# Patient Record
Sex: Male | Born: 1955
Health system: Southern US, Community
[De-identification: ages and names within clinical notes are randomized; demographics above are authoritative.]

## PROBLEM LIST (undated history)

## (undated) DIAGNOSIS — R002 Palpitations: Secondary | ICD-10-CM

## (undated) DIAGNOSIS — I10 Essential (primary) hypertension: Secondary | ICD-10-CM

## (undated) DIAGNOSIS — E785 Hyperlipidemia, unspecified: Secondary | ICD-10-CM

## (undated) DIAGNOSIS — I471 Supraventricular tachycardia, unspecified: Secondary | ICD-10-CM

## (undated) DIAGNOSIS — E876 Hypokalemia: Secondary | ICD-10-CM

## (undated) DIAGNOSIS — I499 Cardiac arrhythmia, unspecified: Secondary | ICD-10-CM

## (undated) HISTORY — DX: Hypokalemia: E87.6

## (undated) HISTORY — DX: Essential (primary) hypertension: I10

## (undated) HISTORY — DX: Supraventricular tachycardia: I47.1

## (undated) HISTORY — PX: TONSILLECTOMY: SUR1361

## (undated) HISTORY — DX: Palpitations: R00.2

## (undated) HISTORY — DX: Hyperlipidemia, unspecified: E78.5

## (undated) HISTORY — DX: Supraventricular tachycardia, unspecified: I47.10

---

## 2006-08-18 ENCOUNTER — Emergency Department: Payer: Self-pay | Admitting: Emergency Medicine

## 2010-12-26 ENCOUNTER — Emergency Department: Payer: Self-pay | Admitting: Emergency Medicine

## 2013-02-28 ENCOUNTER — Ambulatory Visit: Payer: Self-pay | Admitting: General Practice

## 2013-04-29 LAB — CBC
HGB: 16.6 g/dL (ref 13.0–18.0)
MCV: 88 fL (ref 80–100)
RBC: 5.5 10*6/uL (ref 4.40–5.90)
RDW: 13.2 % (ref 11.5–14.5)
WBC: 7.6 10*3/uL (ref 3.8–10.6)

## 2013-04-29 LAB — BASIC METABOLIC PANEL
Anion Gap: 6 — ABNORMAL LOW (ref 7–16)
Chloride: 99 mmol/L (ref 98–107)
Co2: 30 mmol/L (ref 21–32)
Creatinine: 1.31 mg/dL — ABNORMAL HIGH (ref 0.60–1.30)
EGFR (Non-African Amer.): 60 — ABNORMAL LOW
Glucose: 144 mg/dL — ABNORMAL HIGH (ref 65–99)
Osmolality: 275 (ref 275–301)
Potassium: 3.2 mmol/L — ABNORMAL LOW (ref 3.5–5.1)

## 2013-04-29 LAB — CK TOTAL AND CKMB (NOT AT ARMC): CK-MB: 4.9 ng/mL — ABNORMAL HIGH (ref 0.5–3.6)

## 2013-04-30 ENCOUNTER — Observation Stay: Payer: Self-pay | Admitting: Internal Medicine

## 2013-04-30 DIAGNOSIS — I471 Supraventricular tachycardia: Secondary | ICD-10-CM

## 2013-04-30 DIAGNOSIS — I10 Essential (primary) hypertension: Secondary | ICD-10-CM

## 2013-04-30 DIAGNOSIS — R0602 Shortness of breath: Secondary | ICD-10-CM

## 2013-04-30 DIAGNOSIS — R7989 Other specified abnormal findings of blood chemistry: Secondary | ICD-10-CM

## 2013-04-30 LAB — CK TOTAL AND CKMB (NOT AT ARMC): CK-MB: 4.3 ng/mL — ABNORMAL HIGH (ref 0.5–3.6)

## 2013-05-12 ENCOUNTER — Telehealth: Payer: Self-pay

## 2013-05-12 NOTE — Telephone Encounter (Signed)
Patient contacted regarding discharge from [facility] on 04/30/13.  Patient understands to follow up with provider Dr. Mariah Milling on 05/18/13 at 10:30 at [facility]. Patient understands discharge instructions? yes Patient understands medications and regiment? yes Patient understands to bring all medications to this visit? yes

## 2013-05-17 ENCOUNTER — Encounter: Payer: Self-pay | Admitting: Cardiovascular Disease

## 2013-05-17 ENCOUNTER — Ambulatory Visit (INDEPENDENT_AMBULATORY_CARE_PROVIDER_SITE_OTHER): Payer: BC Managed Care – PPO | Admitting: Cardiovascular Disease

## 2013-05-17 ENCOUNTER — Telehealth: Payer: Self-pay

## 2013-05-17 VITALS — BP 140/100 | HR 84 | Ht 70.0 in | Wt 228.2 lb

## 2013-05-17 DIAGNOSIS — E785 Hyperlipidemia, unspecified: Secondary | ICD-10-CM | POA: Insufficient documentation

## 2013-05-17 DIAGNOSIS — I498 Other specified cardiac arrhythmias: Secondary | ICD-10-CM

## 2013-05-17 DIAGNOSIS — R002 Palpitations: Secondary | ICD-10-CM

## 2013-05-17 DIAGNOSIS — I471 Supraventricular tachycardia: Secondary | ICD-10-CM | POA: Insufficient documentation

## 2013-05-17 DIAGNOSIS — I10 Essential (primary) hypertension: Secondary | ICD-10-CM

## 2013-05-17 MED ORDER — ATORVASTATIN CALCIUM 10 MG PO TABS: 10.0000 mg | ORAL_TABLET | Freq: Every day | ORAL | Status: DC

## 2013-05-17 MED ORDER — DILTIAZEM HCL ER COATED BEADS 240 MG PO CP24: 240.0000 mg | ORAL_CAPSULE | Freq: Every day | ORAL | Status: DC

## 2013-05-17 NOTE — Assessment & Plan Note (Signed)
Cardizem increased to 240 mg daily for better blood pressure control. We've asked him to monitor his blood pressure at home

## 2013-05-17 NOTE — Assessment & Plan Note (Signed)
He does report having very elevated cholesterol in the past. He reports 230 and 260 as his numbers. We will start him on Lipitor 10 mg daily

## 2013-05-17 NOTE — Assessment & Plan Note (Signed)
No further episodes since his discharge. We will increase the diltiazem to 240 mg daily for rate control and better blood pressure control

## 2013-05-17 NOTE — Patient Instructions (Addendum)
You are doing well. Please increase the diltiazem to 240 mg daily  Please start generic zyrtec (cetirazine) one a day for allergy Please start lipitor one a day for cholesterol  Please call us if you have new issues that need to be addressed before your next appt.  Your physician wants you to follow-up in: 6 months.  You will receive a reminder letter in the mail two months in advance. If you don't receive a letter, please call our office to schedule the follow-up appointment.

## 2013-05-17 NOTE — Progress Notes (Signed)
Patient ID: Anthony [name], male    DOB: 1956-02-07, 57 y.o.   MRN: [id number]  HPI Comments: Anthony [name] is a 57 year old with history of hypertension with recent admission to the hospital on 04/30/2013 for narrow complex tachycardia concerning for SVT, hypokalemia, severe hypertension, who presents after his hospital admission for routine followup.  He was found to have heart rate of 160, systolic pressure 220. Troponin climbed to 0.3 felt secondary to rate-related event. His HCTZ was held, potassium was repleted. This was initially 3.2 with mildly elevated creatinine 1.31. He was started on diltiazem 120 mg daily. It was felt that his SVT was secondary to his low potassium.  In followup today, he feels well. Blood pressure has been running slightly high. He denies any chest pain or shortness of breath CT scan in the hospital showed no PE, otherwise normal CT of the chest  EKG shows normal sinus rhythm with rate 84 beats per minute, no significant ST or T wave changes   Outpatient Encounter Prescriptions as of 05/17/2013  Medication Sig Dispense Refill  . aspirin 81 MG tablet Take 81 mg by mouth daily.      . fluticasone (FLONASE) 50 MCG/ACT nasal spray Place 2 sprays into the nose daily.      . [DISCONTINUED] diltiazem (CARDIZEM) 120 MG tablet Take 120 mg by mouth daily.      Marland Kitchen atorvastatin (LIPITOR) 10 MG tablet Take 1 tablet (10 mg total) by mouth daily.  30 tablet  6  . diltiazem (CARDIZEM CD) 240 MG 24 hr capsule Take 1 capsule (240 mg total) by mouth daily.  90 capsule  3   No facility-administered encounter medications on file as of 05/17/2013.     Review of Systems  Constitutional: Negative.   HENT: Negative.   Eyes: Negative.   Respiratory: Negative.   Cardiovascular: Negative.   Gastrointestinal: Negative.   Endocrine: Negative.   Musculoskeletal: Negative.   Skin: Negative.   Allergic/Immunologic: Negative.   Neurological: Negative.   Hematological: Negative.    Psychiatric/Behavioral: Negative.   All other systems reviewed and are negative.    BP 140/100  Pulse 84  Ht 5\' 10"  (1.778 m)  Wt 228 lb 4 oz (103.534 kg)  BMI 32.75 kg/m2  Physical Exam  Nursing note and vitals reviewed. Constitutional: He is oriented to person, place, and time. He appears well-developed and well-nourished.  HENT:  Head: Normocephalic.  Nose: Nose normal.  Mouth/Throat: Oropharynx is clear and moist.  Eyes: Conjunctivae are normal. Pupils are equal, round, and reactive to light.  Neck: Normal range of motion. Neck supple. No JVD present.  Cardiovascular: Normal rate, regular rhythm, S1 normal, S2 normal, normal heart sounds and intact distal pulses.  Exam reveals no gallop and no friction rub.   No murmur heard. Pulmonary/Chest: Effort normal and breath sounds normal. No respiratory distress. He has no wheezes. He has no rales. He exhibits no tenderness.  Abdominal: Soft. Bowel sounds are normal. He exhibits no distension. There is no tenderness.  Musculoskeletal: Normal range of motion. He exhibits no edema and no tenderness.  Lymphadenopathy:    He has no cervical adenopathy.  Neurological: He is alert and oriented to person, place, and time. Coordination normal.  Skin: Skin is warm and dry. No rash noted. No erythema.  Psychiatric: He has a normal mood and affect. His behavior is normal. Judgment and thought content normal.      Assessment and Plan

## 2013-05-17 NOTE — Telephone Encounter (Signed)
Spoke w/ pt.  He was inquiring if Dr. Mariah Milling had any openings today.  Pt is sched for TCM appt tomorrow at 10:00. Pt reports his BP last night was 170/112.  He was prescribed diltiazem 120mg  in hospital 04/30/13. Denies chest pain, tightness, or SOB. We have no openings today, but will call pt if there is a cancellation. Pt to call back this afternoon with today's BP reading.

## 2013-05-18 ENCOUNTER — Ambulatory Visit: Payer: BC Managed Care – PPO | Admitting: Cardiovascular Disease

## 2013-05-27 ENCOUNTER — Encounter: Payer: Self-pay | Admitting: *Deleted

## 2013-06-06 ENCOUNTER — Encounter: Payer: Self-pay | Admitting: Cardiovascular Disease

## 2014-02-01 ENCOUNTER — Encounter: Payer: Self-pay | Admitting: Cardiovascular Disease

## 2014-02-01 ENCOUNTER — Ambulatory Visit (INDEPENDENT_AMBULATORY_CARE_PROVIDER_SITE_OTHER): Payer: BC Managed Care – PPO | Admitting: Cardiovascular Disease

## 2014-02-01 VITALS — BP 142/92 | HR 85 | Ht 70.0 in | Wt 234.5 lb

## 2014-02-01 DIAGNOSIS — I498 Other specified cardiac arrhythmias: Secondary | ICD-10-CM

## 2014-02-01 DIAGNOSIS — I471 Supraventricular tachycardia: Secondary | ICD-10-CM

## 2014-02-01 DIAGNOSIS — I158 Other secondary hypertension: Secondary | ICD-10-CM

## 2014-02-01 DIAGNOSIS — R0602 Shortness of breath: Secondary | ICD-10-CM

## 2014-02-01 DIAGNOSIS — E785 Hyperlipidemia, unspecified: Secondary | ICD-10-CM

## 2014-02-01 NOTE — Patient Instructions (Addendum)
You are doing well. No medication changes were made.  Continue to work on the weight  Please call us if you have new issues that need to be addressed before your next appt.  Your physician wants you to follow-up in: 12 months.  You will receive a reminder letter in the mail two months in advance. If you don't receive a letter, please call our office to schedule the follow-up appointment.

## 2014-02-01 NOTE — Assessment & Plan Note (Signed)
Blood pressure is well controlled on today's visit. No changes made to the medications. 

## 2014-02-01 NOTE — Assessment & Plan Note (Signed)
No further arrhythmia. We'll continue calcium channel blocker

## 2014-02-01 NOTE — Progress Notes (Signed)
   Patient ID: Anthony [name], male    DOB: 04/14/1956, 58 y.o.   MRN: [id number]  HPI Comments: [name] is a 58 year old with history of hypertension with admission to the hospital on 04/30/2013 for narrow complex tachycardia concerning for SVT, hypokalemia, severe hypertension, who presents for routine followup.  On his prior clinic visit, HCTZ was held, he was started on diltiazem. In followup today, he reports that his blood pressure is well controlled at home and denies having any further arrhythmia. Blood pressure typically runs 130s over 80  Recent lab work showing total cholesterol 210  On his prior admission to the hospital, He was found to have heart rate of 160, systolic pressure 220. Troponin climbed to 0.3 felt secondary to rate-related event. His HCTZ was held, potassium was repleted. This was initially 3.2 with mildly elevated creatinine 1.31. He was started on diltiazem 120 mg daily. It was felt that his SVT was secondary to his low potassium.  Prior CT scan in the hospital showed no PE, otherwise normal CT of the chest  EKG shows normal sinus rhythm with rate 85 beats per minute, no significant ST or T wave changes   Outpatient Encounter Prescriptions as of 02/01/2014  Medication Sig  . atorvastatin (LIPITOR) 10 MG tablet Take 1 tablet (10 mg total) by mouth daily.  Marland Kitchen. diltiazem (CARDIZEM CD) 240 MG 24 hr capsule Take 1 capsule (240 mg total) by mouth daily.    Review of Systems  Constitutional: Negative.   HENT: Negative.   Eyes: Negative.   Respiratory: Negative.   Cardiovascular: Negative.   Gastrointestinal: Negative.   Endocrine: Negative.   Musculoskeletal: Negative.   Skin: Negative.   Allergic/Immunologic: Negative.   Neurological: Negative.   Hematological: Negative.   Psychiatric/Behavioral: Negative.   All other systems reviewed and are negative.   BP 142/92  Pulse 85  Ht 5\' 10"  (1.778 m)  Wt 234 lb 8 oz (106.369 kg)  BMI 33.65 kg/m2  Physical  Exam  Nursing note and vitals reviewed. Constitutional: He is oriented to person, place, and time. He appears well-developed and well-nourished.  HENT:  Head: Normocephalic.  Nose: Nose normal.  Mouth/Throat: Oropharynx is clear and moist.  Eyes: Conjunctivae are normal. Pupils are equal, round, and reactive to light.  Neck: Normal range of motion. Neck supple. No JVD present.  Cardiovascular: Normal rate, regular rhythm, S1 normal, S2 normal, normal heart sounds and intact distal pulses.  Exam reveals no gallop and no friction rub.   No murmur heard. Pulmonary/Chest: Effort normal and breath sounds normal. No respiratory distress. He has no wheezes. He has no rales. He exhibits no tenderness.  Abdominal: Soft. Bowel sounds are normal. He exhibits no distension. There is no tenderness.  Musculoskeletal: Normal range of motion. He exhibits no edema and no tenderness.  Lymphadenopathy:    He has no cervical adenopathy.  Neurological: He is alert and oriented to person, place, and time. Coordination normal.  Skin: Skin is warm and dry. No rash noted. No erythema.  Psychiatric: He has a normal mood and affect. His behavior is normal. Judgment and thought content normal.      Assessment and Plan

## 2014-02-01 NOTE — Assessment & Plan Note (Addendum)
We did discuss increasing the dose of his Lipitor up to 20 mg daily. He prefers diet and exercise to start.

## 2014-06-11 ENCOUNTER — Emergency Department: Payer: Self-pay | Admitting: Emergency Medicine

## 2014-11-24 NOTE — Consult Note (Signed)
General Aspect Anthony [name] is a 59 year old African American gentleman with past medical history of hypertension presenting with chest discomfort and palpitations. Cardiology was consulted for narrow complex tachycardia/arrhythmia with severe HTN.   He described having an episode of chest discomfort with associated palpitations.  He describes his chest discomfort as 2 to 3 out of 10 in intensity, nonradiating, located in the left chest.  No worsening or relieving factors.  He noticed associated fluttering sensation.  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  He then confirmed this by using automated blood pressure cuff and found to be hypertensive around 220/120 with heart rate in the 160s at that time, so he decided to present to the Emergency Department.  He was found to be tachycardic with a heart rate of 150 and then this spontaneously broke.  No medications or interventions were performed.  The episode lasted approximately one hour total duration.  Currently, he is asymptomatic and having no complaints.   Present Illness . FAMILY HISTORY:  Significant for diabetes in both parents which was a late-onset.   SOCIAL HISTORY:  Denies any alcohol, tobacco or drug usage.  Does not drink caffeinated beverages.  Works as an Microbiologist.   ALLERGIES:  No known drug allergies.   HOME MEDICATIONS:  Aspirin 81 mg by mouth daily, hydrochlorothiazide 25 mg daily,  taking daily.   Physical Exam:  GEN well developed, well nourished, no acute distress   HEENT PERRL, hearing intact to voice   NECK supple   RESP normal resp effort  clear BS   CARD Regular rate and rhythm  No murmur   ABD soft   EXTR negative edema   SKIN normal to palpation   NEURO motor/sensory function intact   PSYCH alert, A+O to time, place, person, good insight   Review of Systems:  Subjective/Chief Complaint Chest tightness, SOB resolved    General: No Complaints   Skin: No Complaints   ENT: No Complaints   Eyes: No Complaints   Neck: No Complaints   Respiratory: Short of breath   Cardiovascular: Chest pain or discomfort  Tightness   Gastrointestinal: No Complaints   Genitourinary: No Complaints   Vascular: No Complaints   Musculoskeletal: No Complaints   Neurologic: No Complaints   Hematologic: No Complaints   Endocrine: No Complaints   Psychiatric: No Complaints   Review of Systems: All other systems were reviewed and found to be negative   Medications/Allergies Reviewed Medications/Allergies reviewed     low test:    hypertension:        Admit Diagnosis:   PALPITATIONS: Onset Date: 30-Apr-2013, Status: Active, Description: PALPITATIONS  Lab Results:  Thyroid:  26-Sep-14 22:35   Thyroid Stimulating Hormone 3.59 (0.45-4.50 (International Unit)  ----------------------- Pregnant patients have  different reference  ranges for TSH:  - - - - - - - - - -  Pregnant, first trimetser:  0.36 - 2.50 uIU/mL)  Routine Chem:  26-Sep-14 22:35   Result Comment TROPONIN - RESULTS VERIFIED BY REPEAT TESTING.  - C/JARRELL HILL AT 2322 04/29/13.PMH  - READ-BACK PROCESS PERFORMED.  Result(s) reported on 29 Apr 2013 at 11:25PM.  Glucose, Serum  144  BUN 18  Creatinine (comp)  1.31  Sodium, Serum  135  Potassium, Serum  3.2  Chloride, Serum 99  CO2, Serum 30  Calcium (Total), Serum 9.2  Anion Gap  6  Osmolality (calc) 275  eGFR (African American) >60  eGFR (Non-African American)  60 (eGFR values <48mL/min/1.73 m2 may be an indication of chronic kidney disease (CKD). Calculated eGFR is useful in patients with stable renal function. The eGFR calculation will not be reliable in acutely ill patients when serum creatinine is changing rapidly. It is not useful in  patients on dialysis. The eGFR calculation may not be applicable to patients at the low and high extremes of body sizes, pregnant women, and  vegetarians.)  Cardiac:  26-Sep-14 22:35   CK, Total  546  CPK-MB, Serum  4.9 (Result(s) reported on 29 Apr 2013 at 11:21PM.)  Troponin I  0.06 (0.00-0.05 0.05 ng/mL or less: NEGATIVE  Repeat testing in 3-6 hrs  if clinically indicated. >0.05 ng/mL: POTENTIAL  MYOCARDIAL INJURY. Repeat  testing in 3-6 hrs if  clinically indicated. NOTE: An increase or decrease  of 30% or more on serial  testing suggests a  clinically important change)  27-Sep-14 06:31   Troponin I  0.33 (0.00-0.05 0.05 ng/mL or less: NEGATIVE  Repeat testing in 3-6 hrs  if clinically indicated. >0.05 ng/mL: POTENTIAL  MYOCARDIAL INJURY. Repeat  testing in 3-6 hrs if  clinically indicated. NOTE: An increase or decrease  of 30% or more on serial  testing suggests a  clinically important change)  Routine Hem:  26-Sep-14 22:35   WBC (CBC) 7.6  RBC (CBC) 5.50  Hemoglobin (CBC) 16.6  Hematocrit (CBC) 48.4  Platelet Count (CBC) 269 (Result(s) reported on 29 Apr 2013 at 10:51PM.)  MCV 88  MCH 30.1  MCHC 34.3  RDW 13.2   EKG:  Interpretation EKG shows narrow complex rhythm with rate 150 bpM, consistent with SVT.   Radiology Results: XRay:    26-Sep-14 22:59, Chest PA and Lateral  Chest PA and Lateral   REASON FOR EXAM:    Chest Pain  COMMENTS:       PROCEDURE: DXR - DXR CHEST PA (OR AP) AND LATERAL  - Apr 29 2013 10:59PM     RESULT: Comparison is made to the study of February 28, 2013.    The lungs are adequately inflated. There is no focal infiltrate.The   cardiac silhouette is normal in size. The mediastinum is normal in width.   There is no pleural effusion or pneumothorax. The observed portions of   the bony thorax exhibit no acute abnormalities. Degenerative disc change   of the midthoracic spine is present.    IMPRESSION:  There is no evidence of acute cardiopulmonary abnormality.   Dictation Site: 1        Verified By: DAVID A. Martinique, M.D., MD  CT:    27-Sep-14 00:00, CT Chest With  Contrast  CT Chest With Contrast   REASON FOR EXAM:    sob, palpitations  COMMENTS:   May transport without cardiac monitor    PROCEDURE: CT  - CT CHEST WITH CONTRAST  - Apr 30 2013 12:00AM     RESULT: CT angiography of the chest was performed following   administration of 100 cc of Isovue-370. Review of multiplanar   reconstructed images and MIP images was performed on the the VIA monitor.    Contrast within the pulmonary arterial tree is normal in appearance.   There are no filling defects to suggest an acute pulmonary embolism.The   cardiac chambers are normal in size. The caliber of the thoracic aorta is   normal. No pathologic sized mediastinal or hilar lymph nodes are   demonstrated.  There is no pleural nor pericardial effusion.  At lung window settings there is no interstitial nor alveolar infiltrate.   No suspicious pulmonary parenchymal nodules or masses are demonstrated.    Within the upper abdomen the observed portions of the liver and spleen   and adrenal glands appear normal. The thoracic vertebral bodies are  preserved in height. Anterior endplate osteophytes are noted at multiple   levels.    IMPRESSION:   1. There is no evidence of an acute pulmonary embolism.  2. No acute thoracic aortic pathology is demonstrated.  3. There is no evidence of CHF nor of pneumonia.    A preliminary report was sent to the emergency department at the   conclusion of the study.   Dictation Site: 1        Verified By: DAVID A. Martinique, M.D., MD    No Known Allergies:   Vital Signs/Nurse's Notes: **Vital Signs.:   27-Sep-14 07:16  Vital Signs Type Routine  Temperature Temperature (F) 97.9  Celsius 36.6  Temperature Source oral  Pulse Pulse 83  Respirations Respirations 18  Systolic BP Systolic BP 756  Diastolic BP (mmHg) Diastolic BP (mmHg) 60  Mean BP 76  Pulse Ox % Pulse Ox % 97  Pulse Ox Activity Level  At rest  Oxygen Delivery Room Air/ 21 %    Impression Anthony [name] is a 59 year old African American gentleman with past medical history of hypertension presenting with chest discomfort and palpitations. Cardiology was consulted for narrow complex tachycardia/arrhythmia with severe HTN.  1) Arrhythmia/SVT Rapid narrow complex tachycardia 150 to 180 bpm Possibly from low potassium (secondary to HCTZ) Converted to NSR in the ER  First epsiode per the patient --Would hold HCTZ, start cardizem 120 mg daily Also need diltiazem 30 mg to take PRN for breakthrough arrhythmia Will arrange follow up in the office  2) Elevated troponin, Demand ischemia from elevarted heart rate, >150 bpm, also with severe HTN at the time of his tachycardia Medical management at this time, aspirin, diltiazem Not on b-blocker as he reports some wheezing recentlyy  3) HTN: Will hold HCTZ secondary to low potassium/arrhythmia Start cardizem 120 mg daily He will monitor BP at home and we can discuss this in follow up  4) Renal dysfunction Hold HCTZ and monitor as ann outpt  5) Hypokalemia Secondary to HCTZ, now held   Electronic Signatures: Ida Rogue (MD)  (Signed 27-Sep-14 11:29)  Authored: General Aspect/Present Illness, History and Physical Exam, Review of System, Past Medical History, Health Issues, Home Medications, Labs, EKG , Radiology, Allergies, Vital Signs/Nurse's Notes, Impression/Plan   Last Updated: 27-Sep-14 11:29 by Ida Rogue (MD)

## 2014-11-24 NOTE — Discharge Summary (Signed)
PATIENT NAME:  Anthony [name], Anthony [name] MR#:  [id number] DATE OF BIRTH:  July 08, 1956  DATE OF ADMISSION:  04/30/2013 DATE OF DISCHARGE:  04/30/2013  ADMITTING DIAGNOSIS: Chest discomfort, heart beating fast.   DISCHARGE DIAGNOSES: 1.  Chest discomfort felt to be supraventricular tachycardia with narrow complex tachycardia, likely arteriovenous nodal re-entry tachycardia, spontaneously resolved, status post cardiology evaluation with further outpatient evaluation.  2.  Likely chronic renal failure.  3.  Hypokalemia.  4.  Elevated cardiac enzymes felt to be due to demand ischemia as a result of the tachycardia.   CONSULTANTS: Dr. Mariah MillingGollan.  LABORATORY, DIAGNOSTIC AND RADIOLOGICAL DATA: EKG on presentation showed SVT. WBC 7.6, hemoglobin 16.6; platelet count was 269. CPK was 546, then 430. Troponin was 0.06, then 0.33. His creatinine was 1.31, sodium 135, potassium 3.2. CT of the chest showed no evidence of pulmonary embolism. No thoracic aortic pathology. No evidence of CHF or pneumonia.   HOSPITAL COURSE: Please refer to [name] and P done by the admitting physician. The patient is a 59 year old African American male with history of hypertension who presented with chest discomfort and palpitations. He noticed his pulse was in the 170s to 180s. The patient's blood pressure was also elevated; therefore, EMS was called. When EMS arrived, he was noted to be in SVT with narrow complex tachycardia. The patient spontaneously converted without any intervention. He was admitted to the hospital and monitored overnight. He had no further arrhythmias. He was seen by Dr. Mariah MillingGollan, who recommended outpatient stress test and echo. The patient is currently doing well. He did have some electrolyte imbalances, which are replaced. These will need to be rechecked with a primary care provider. At this time, he is stable for discharge.   DISCHARGE MEDICATIONS: Aspirin 81 mg 1 tab p.o. daily, diltiazem 120 daily, Flonase 50 mcg 1 spray  nasally daily, Cardizem 30 mg 1 tab p.o. daily as needed for heart rate greater than 100.   DIET: Low-sodium.   ACTIVITY: As tolerated.   FOLLOWUP: With Dr. Mariah MillingGollan in 1 to 2 weeks. The patient is told to stop HCTZ.   NOTE: 35 minutes spent.    ____________________________ Lacie ScottsShreyang [name]. Allena KatzPatel, MD shp:jm D: 05/01/2013 08:35:58 ET T: 05/01/2013 11:48:56 ET JOB#: [id number]  cc: Leola Fiore [name]. Allena KatzPatel, MD, <Dictator> Charise CarwinSHREYANG [name] Jamil Armwood MD ELECTRONICALLY SIGNED 05/05/2013 16:18

## 2014-11-24 NOTE — H&P (Signed)
PATIENT NAME:  Anthony [name], [name] MR#:  [id number] DATE OF BIRTH:  [birth year]  DATE OF ADMISSION:  04/29/2013  REFERRING PHYSICIAN:  Dr. Shaune PollackLord.   PRIMARY CARE PHYSICIAN:  Dr. Dorothey BasemanStrickland.   CHIEF COMPLAINT:  Chest discomfort.   HISTORY OF PRESENT ILLNESS:  [name] is a 59 year old African American gentleman with past medical history of hypertension who is presenting with chest discomfort and palpitations while at rest earlier today.  He described having an episode of chest discomfort with associated palpitations.  He describes his chest discomfort as 2 to 3 out of 10 in intensity, nonradiating, located in the left chest.  No worsening or relieving factors.  He noticed associated fluttering sensation.  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  He then confirmed this by using automated blood pressure cuff and found to be hypertensive around 220/120 with heart rate in the 160s at that time, so he decided to present to the Emergency Department.  He was found to be tachycardic with a heart rate of 150 and then this spontaneously broke.  No medications or interventions were performed.  The episode lasted approximately one hour total duration.  Currently, he is asymptomatic and having no complaints.   REVIEW OF SYSTEMS:  CONSTITUTIONAL:  Denies any fevers, chills, fatigue or weight changes or pain.  EYES:  Denies any vision changes, eye pain or redness.  EARS, NOSE, THROAT:  Denies any ear pain, discharge or dysphagia.  RESPIRATORY:  Denies cough.  Does mention chronic wheeze for approximately two months which is intermittent.  Denies dyspnea or hemoptysis.  CARDIOVASCULAR:  Described chest discomfort and palpitations as above.  GASTROINTESTINAL:  Denies nausea, vomiting, diarrhea, abdominal pain.  GENITOURINARY:  Denies dysuria, hematuria.  ENDOCRINE:  Denies polyuria, nocturia, heat or cold intolerance, increased sweating or  tremors.  HEMATOLOGIC AND LYMPHATIC:  Denies any easy bruising or bleeding.  SKIN:  Denies any rashes or lesions.  MUSCULOSKELETAL:  Denies any pain in the neck, back, shoulders, knees or hips.  Denies arthritis.  NEUROLOGIC:  Denies any numbness or paralysis or paresthesias.  PSYCHIATRIC:  Denies any anxiety or depressive symptoms.   PAST MEDICAL HISTORY:  Hypertension.   FAMILY HISTORY:  Significant for diabetes in both parents which was a late-onset.   SOCIAL HISTORY:  Denies any alcohol, tobacco or drug usage.  Does not drink caffeinated beverages.  Works as an Therapist, nutritionalMS responder.   ALLERGIES:  No known drug allergies.   HOME MEDICATIONS:  Aspirin 81 mg by mouth daily, hydrochlorothiazide of an unknown dosage, taking daily.   PHYSICAL EXAMINATION: VITAL SIGNS:  Temperature 98.7, heart rate 78, respirations 20, blood pressure 134/88, saturating 99% on supplemental O2.  Weight 102.1 kg, BMI of 32.3.  GENERAL:  Well-nourished, well-developed, African American gentleman in no acute distress.  HEAD:  Normocephalic, atraumatic.  EYES:  Pupils equal, round and reactive to light as well as accommodation.  Extraocular muscles intact.  No scleral icterus.  MOUTH:  Moist mucosal membranes.  Dentition intact.  No abscesses.  EARS, NOSE, THROAT:  Clear without exudate.  No external lesions.  NECK:  Supple.  No thyromegaly.  No thyroid bruit.  No nodules appreciated.  No JVD.  PULMONARY:  Clear to auscultation bilaterally without wheezes, rubs or rhonchi.  No use of accessory muscles.  Good air entry bilaterally.  Chest is nontender to palpation.  CARDIOVASCULAR:  S1, S2, regular  rate and rhythm.  No murmurs, rubs or gallops.  No edema.  Pedal pulses 2+ bilaterally.  GASTROINTESTINAL:  Soft, nontender, nondistended.  No masses.  Positive bowel sounds.  No hepatosplenomegaly.  MUSCULOSKELETAL:  No swelling, clubbing, edema, range of motion is full in all extremities.  NEUROLOGIC:  Cranial nerves II  through XII are intact.  No gross neurological deficits.  Sensation intact.  Reflexes intact.  No tremor.  SKIN:  No ulcerations, lesions or rashes.  Skin is warm and dry.  Turgor is intact.   NEUROLOGIC:  Mood and affect within normal limits.  He is awake, alert and oriented x 3.  Insight and judgment are intact.   LABORATORY DATA:  Sodium 135, potassium 3.2, chloride 99, bicarb 30, BUN 18, creatinine 1.31, of unknown baseline, glucose 144, troponin I 0.06.  CK 546, CK-MB 4.9.  WBC 7.6, hemoglobin 16.6, platelets 269.  Initial EKG, heart rate is 154, narrow complex sinus tachycardia, which is in a regular pattern.  Repeat EKG, normal sinus rhythm, heart rate 94, possible LVH.  No conduction abnormalities noted.  CT chest rule out PE was performed by the Emergency Department physicians.  No acute findings in the chest were noted.   ASSESSMENT AND PLAN:  A 59 year old gentleman with history of hypertension presenting with chest discomfort and palpitations.  1.  Narrow complex tachycardia, likely atrioventricular nodal reentry tachycardia, spontaneously broke.  Check TSH, cardiac enzymes.  Consult cardiology.  May require a beta-blockade.  2.  Chest discomfort with elevated troponins.  Trend cardiac enzymes.  No evidence of ischemia on EKG.  He is given aspirin.  Check lipids and give a statin.  3.  Hypertension, on hydrochlorothiazide, hold for now given possible acute kidney injury.  4.  Acute kidney injury with unknown baseline.  IV fluid hydration with normal saline 100 mL an hour.  5.  DVT prophylaxis, heparin subQ.  6.  THE PATIENT IS FULL CODE.   TIME SPENT:  45 minutes.    ____________________________ Cletis Athens. Tekesha Almgren, MD dkh:ea D: 04/30/2013 02:36:41 ET T: 04/30/2013 03:38:20 ET JOB#: [id number]  cc: Cletis Athens. Lorenza Shakir, MD, <Dictator> Tracey Stewart Synetta Shadow MD ELECTRONICALLY SIGNED 04/30/2013 21:56

## 2015-01-14 IMAGING — CR DG CHEST 2V
1 series · 2 of 2 positions shown · non-contrast
Comparison: none

REASON FOR EXAM: Chest Pain
COMMENTS:

[Series 1: pa · 0.17mm/px · 2 of 2 slices shown]
[im 1/2]
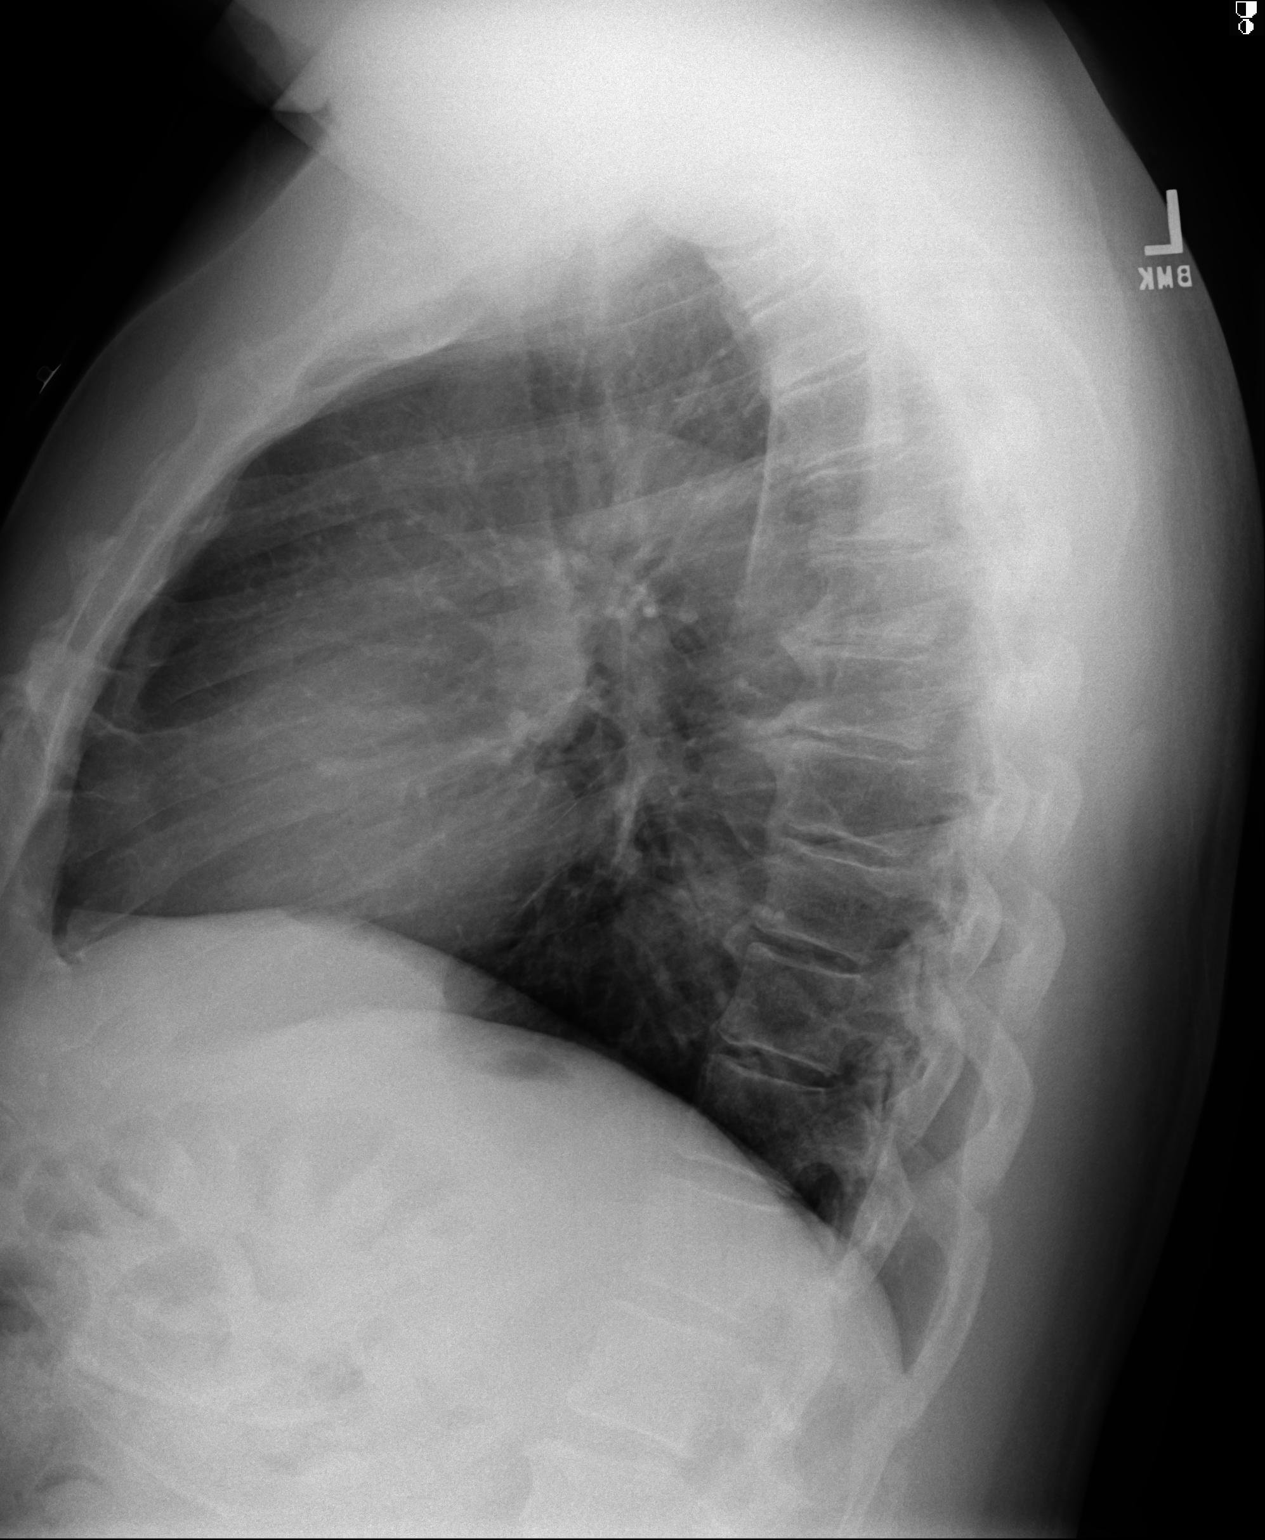
[im 2/2]
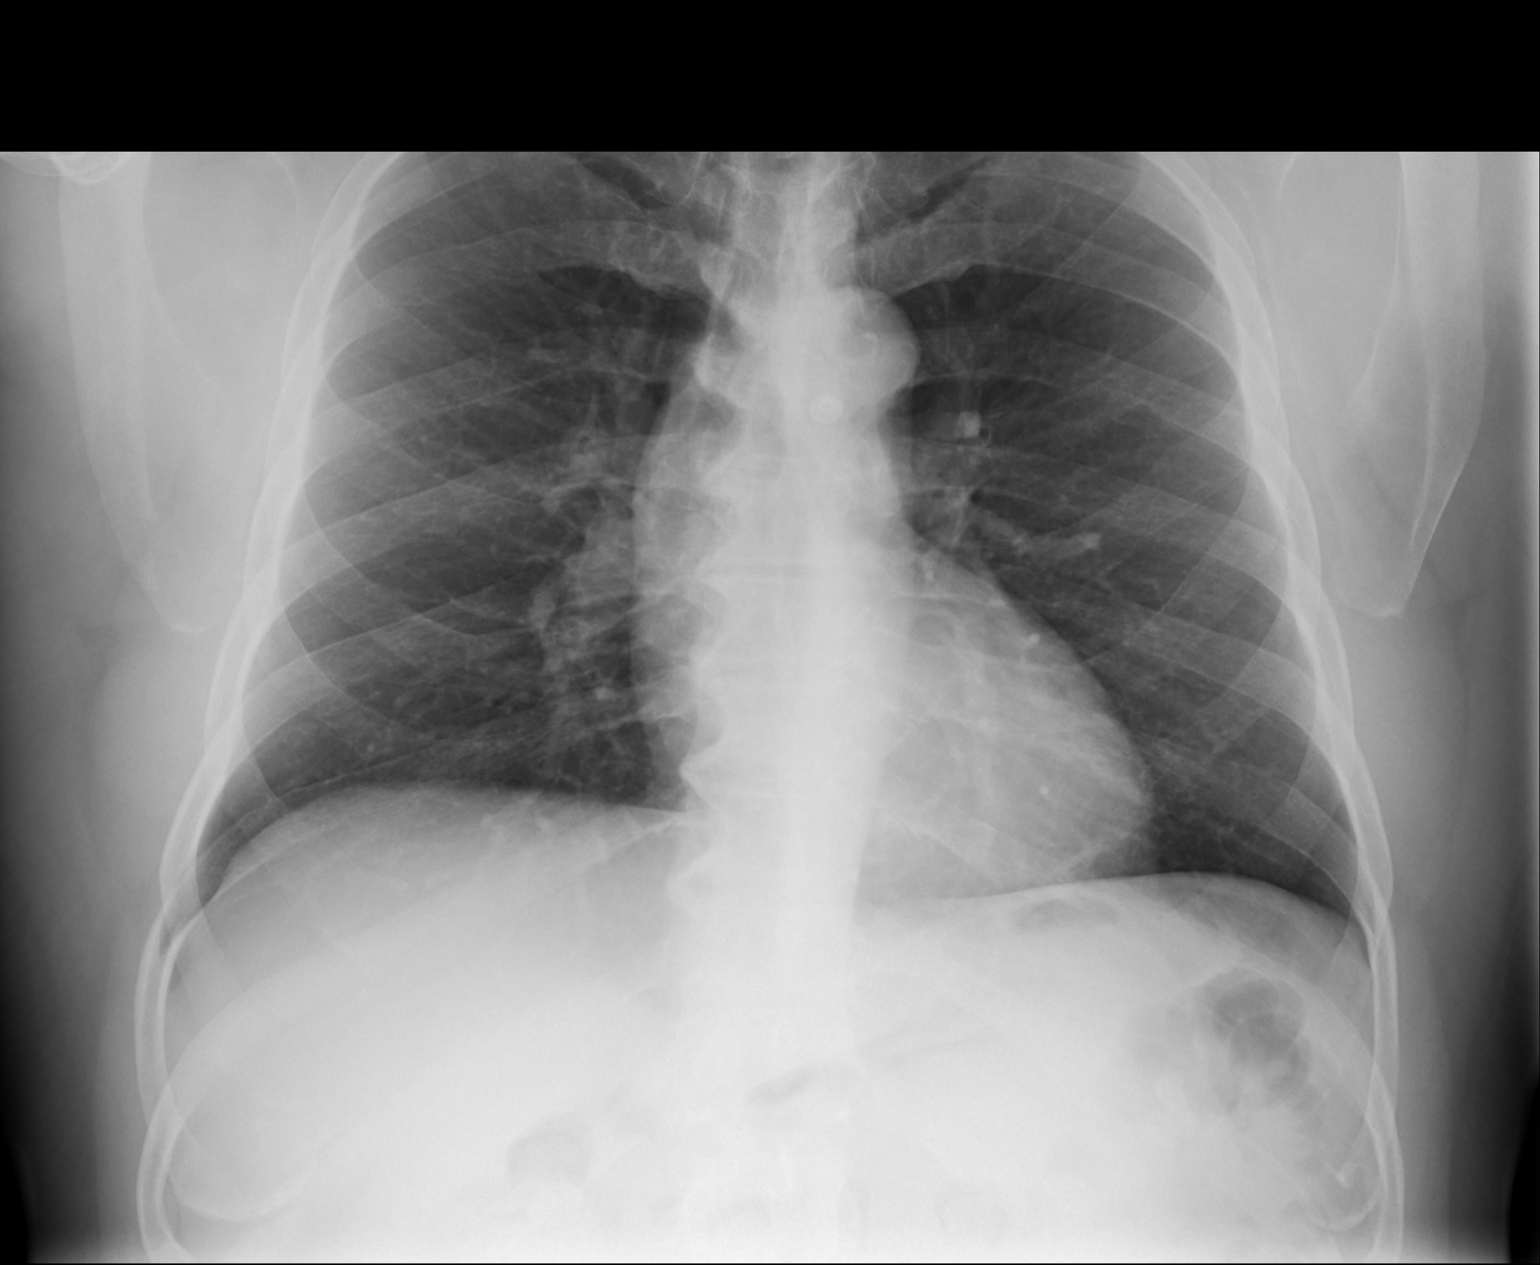

[2 of 2 positions shown; findings below may reference images not displayed]

PROCEDURE:     DXR - DXR CHEST PA (OR AP) AND LATERAL  - April 29, 2013 [DATE]

RESULT:     Comparison is made to the study February 28, 2013.

The lungs are adequately inflated. There is no focal infiltrate. The cardiac
silhouette is normal in size. The mediastinum is normal in width. There is
no pleural effusion or pneumothorax. The observed portions of the bony
thorax exhibit no acute abnormalities. Degenerative disc change of the
midthoracic spine is present.
IMPRESSION: There is no evidence of acute cardiopulmonary abnormality.

[REDACTED]

## 2015-01-14 IMAGING — CT CT CHEST W/ CM
2 of 3 series · 15 of 36 positions shown, 18 images · IV contrast (APPLIED)
Comparison: none

REASON FOR EXAM: sob, palpitations
COMMENTS:   May transport without cardiac monitor

[Series 5: lung windows · axial · 0.73mm/px · z∈[+101,+347]mm · 12 of 98 slices shown, 15 images]
[im 8/98  mediastinal]
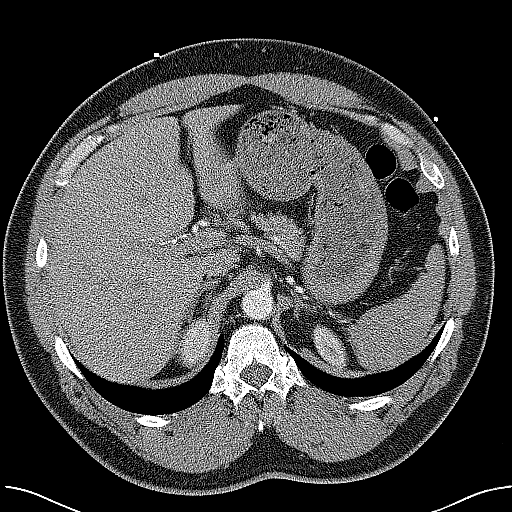
[im 8/98  lung]
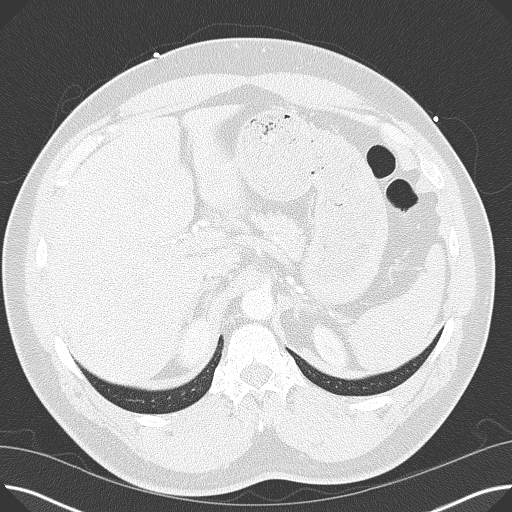
[im 15/98  lung]
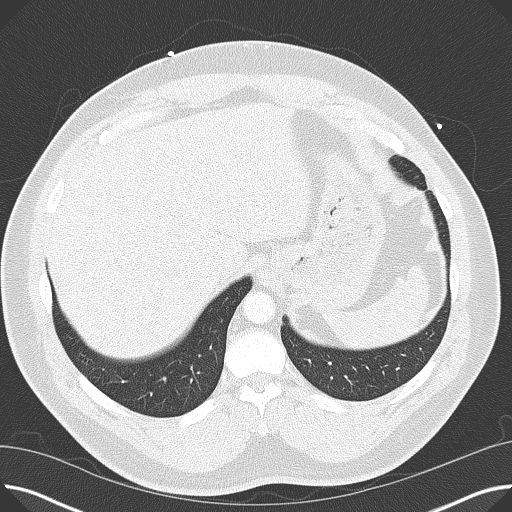
[im 23/98  lung]
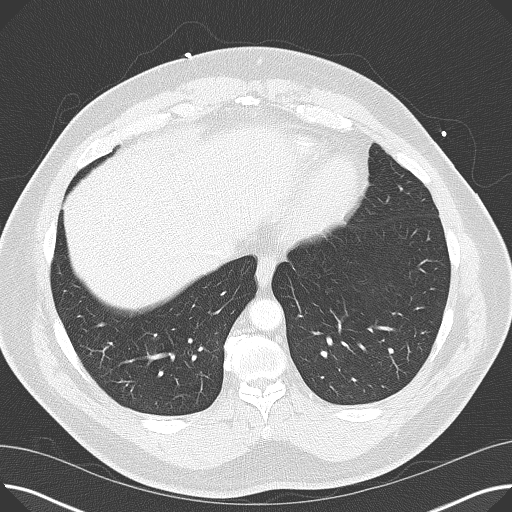
[im 30/98  lung]
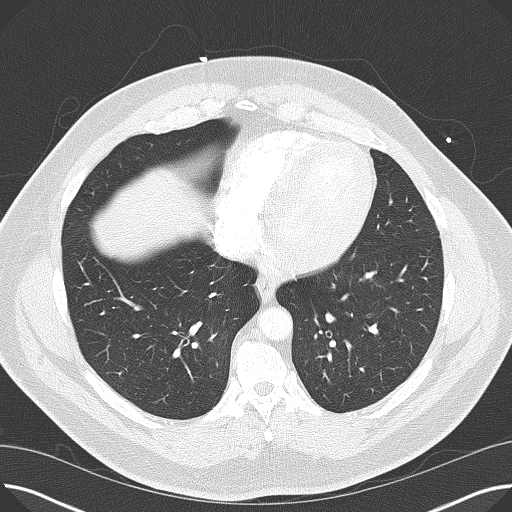
[im 38/98  mediastinal]
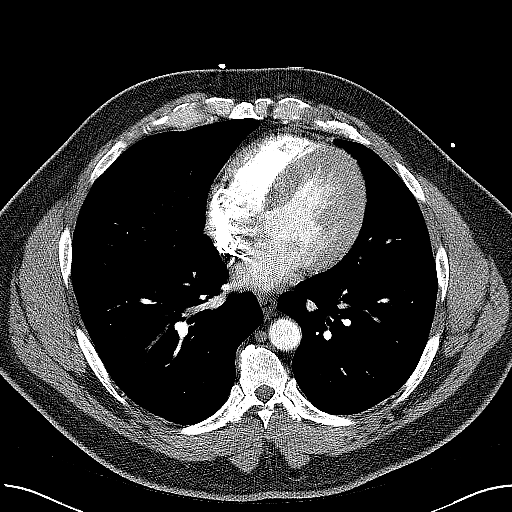
[im 38/98  lung]
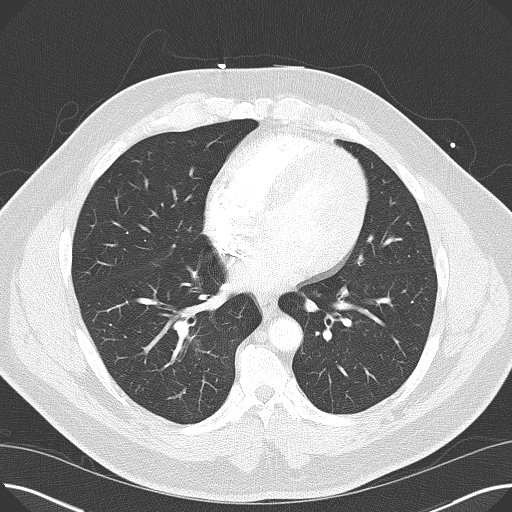
[im 45/98  lung]
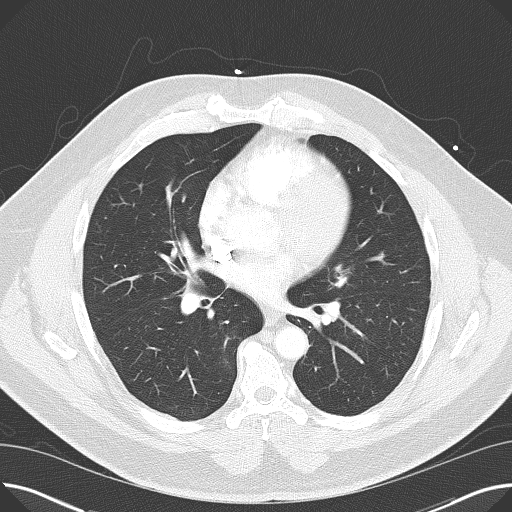
[im 58/98  lung]
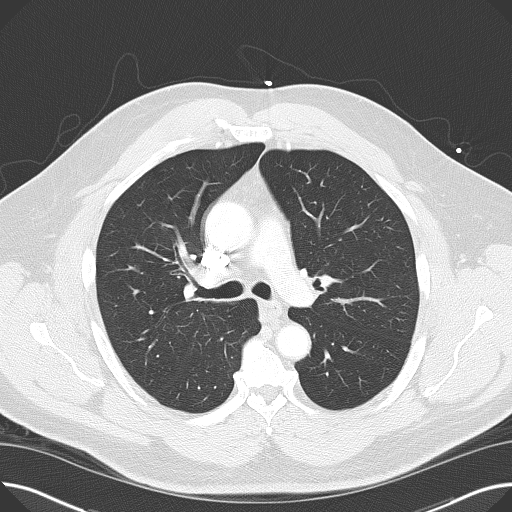
[im 60/98  lung]
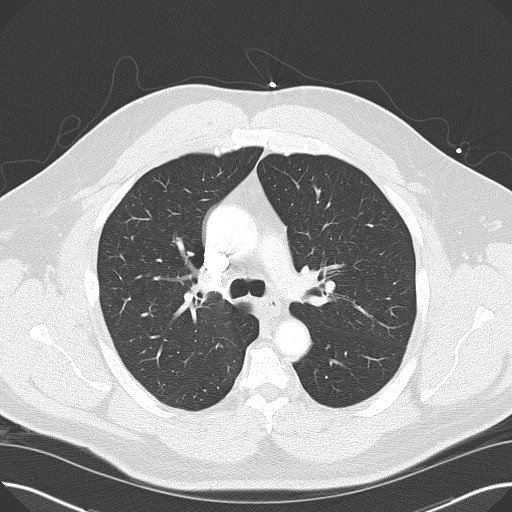
[im 68/98  mediastinal]
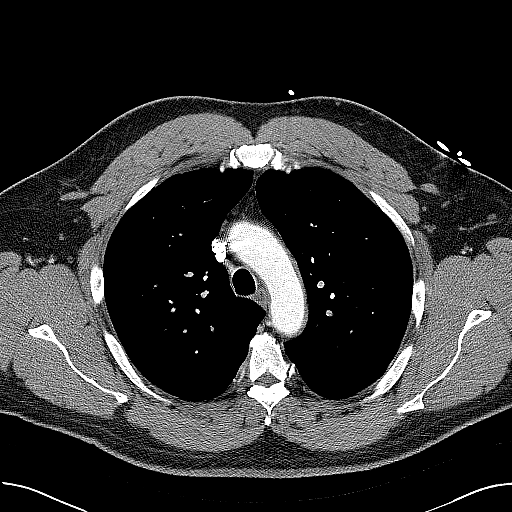
[im 68/98  lung]
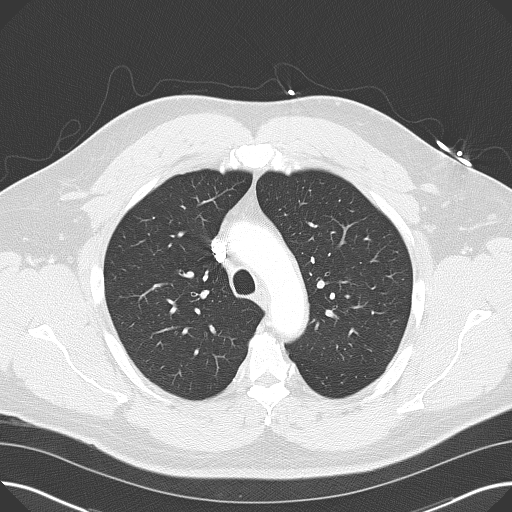
[im 75/98  lung]
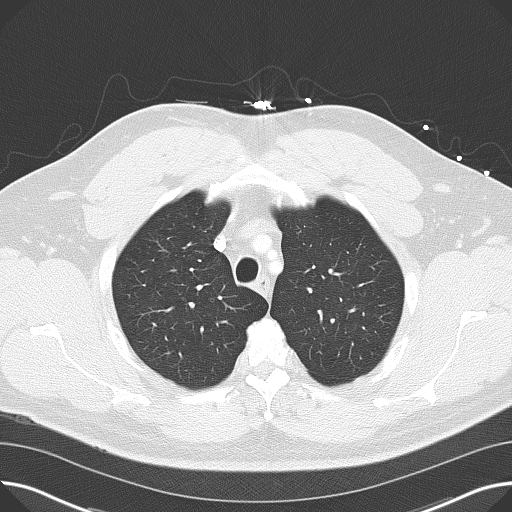
[im 83/98  lung]
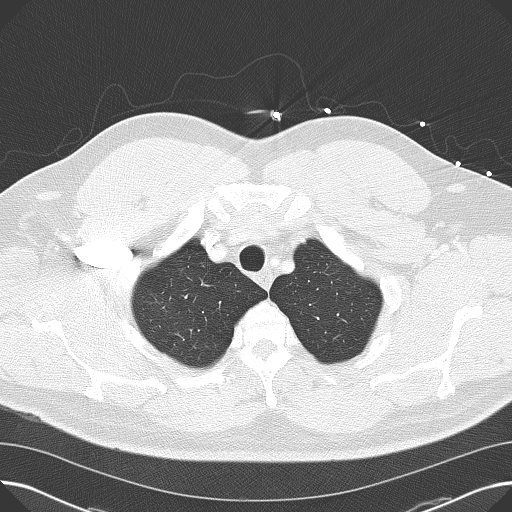
[im 90/98  lung]
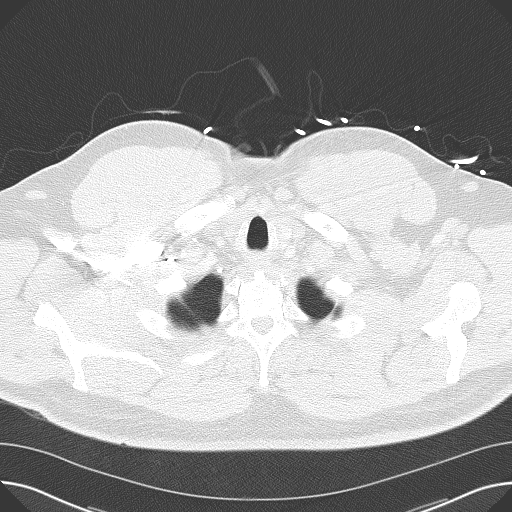

[Series 8: soft tissue coronal · coronal · 0.74mm/px · 3 of 78 slices shown]
[im 16/78  lung]
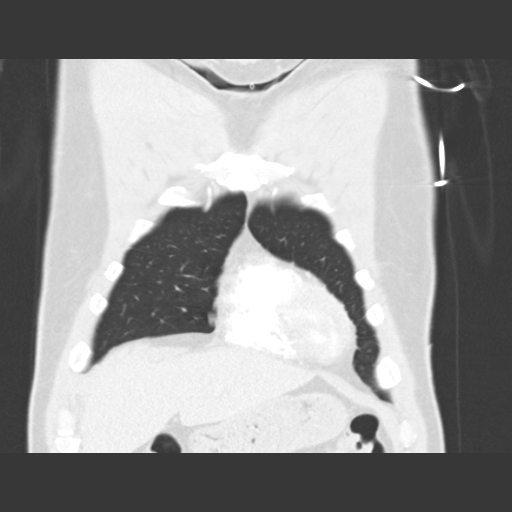
[im 31/78  lung]
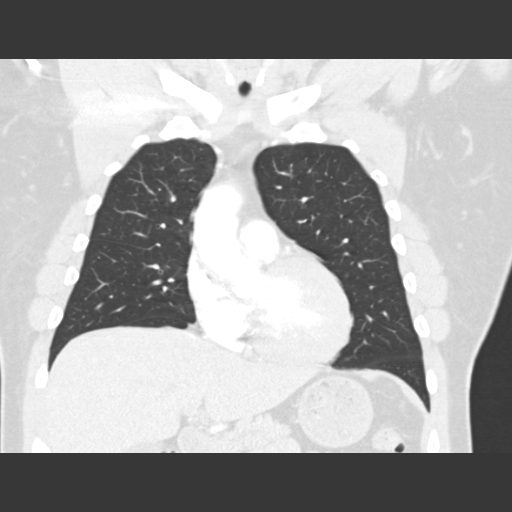
[im 47/78  lung]
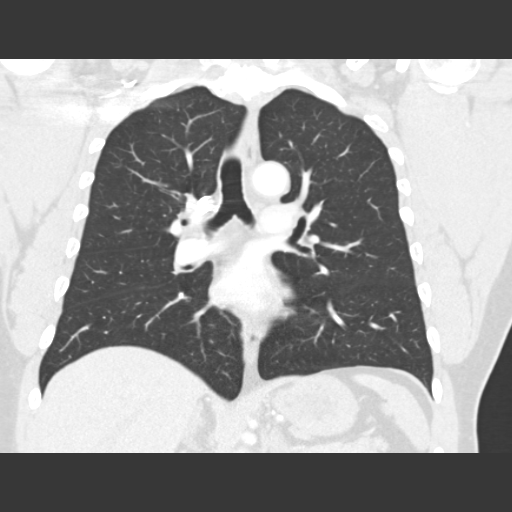

[15 of 36 positions shown; findings below may reference images not displayed]

PROCEDURE:     CT  - CT CHEST WITH CONTRAST  - April 30, 2013 [DATE]

RESULT:     CT angiography of the chest was performed following
administration of 100 cc of 2sovue-MHS. Review of multiplanar reconstructed
images and MIP images was performed on the the VIA monitor.

Contrast within the pulmonary arterial tree is normal in appearance. There
are no filling defects to suggest an acute pulmonary embolism. The cardiac
chambers are normal in size. The caliber of the thoracic aorta is normal. No
pathologic sized mediastinal or hilar lymph nodes are demonstrated. There is
no pleural nor pericardial effusion.

At lung window settings there is no interstitial nor alveolar infiltrate. No
suspicious pulmonary parenchymal nodules or masses are demonstrated.

Within the upper abdomen the observed portions of the liver and spleen and
adrenal glands appear normal. The thoracic vertebral bodies are preserved in
height. Anterior endplate osteophytes are noted at multiple levels.
IMPRESSION: 1. There is no evidence of an acute pulmonary embolism.
2. No acute thoracic aortic pathology is demonstrated.
3. There is no evidence of CHF nor of pneumonia.

A preliminary report was sent to the [HOSPITAL] the conclusion
of the study.

[REDACTED]

## 2015-05-10 ENCOUNTER — Ambulatory Visit (INDEPENDENT_AMBULATORY_CARE_PROVIDER_SITE_OTHER): Payer: BLUE CROSS/BLUE SHIELD | Admitting: Cardiovascular Disease

## 2015-05-10 ENCOUNTER — Encounter: Payer: Self-pay | Admitting: Cardiovascular Disease

## 2015-05-10 VITALS — BP 186/125 | HR 83 | Ht 70.0 in | Wt 241.8 lb

## 2015-05-10 DIAGNOSIS — I158 Other secondary hypertension: Secondary | ICD-10-CM | POA: Diagnosis not present

## 2015-05-10 DIAGNOSIS — I471 Supraventricular tachycardia, unspecified: Secondary | ICD-10-CM

## 2015-05-10 DIAGNOSIS — E785 Hyperlipidemia, unspecified: Secondary | ICD-10-CM

## 2015-05-10 DIAGNOSIS — R0602 Shortness of breath: Secondary | ICD-10-CM

## 2015-05-10 MED ORDER — ATORVASTATIN CALCIUM 20 MG PO TABS: 20.0000 mg | ORAL_TABLET | Freq: Every day | ORAL | Status: DC

## 2015-05-10 MED ORDER — DILTIAZEM HCL ER COATED BEADS 240 MG PO CP24
240.0000 mg | ORAL_CAPSULE | Freq: Every day | ORAL | Status: DC
Start: 2015-05-10 — End: 2016-05-02

## 2015-05-10 NOTE — Progress Notes (Signed)
Patient ID: Anthony [name], male    DOB: Dec 11, 1955, 59 y.o.   MRN: [id number]  HPI Comments: Anthony [name] is a 59 year old with history of hypertension with admission to the hospital on 04/30/2013 for narrow complex tachycardia concerning for SVT, hypokalemia, severe hypertension, who presents for routine followup of his hypertension  In follow-up today, he is not taking any of his medications including Lipitor or diltiazem He is not checking his blood pressure at home. Blood pressure is very elevated today He does report some mild shortness of breath with exertion Weight continues to be a problem, reports he has been gaining weight recently. He is willing to restart his medications. He has not had follow-up with primary care in a long time. No appointment in the near future  EKG on today's visit shows normal sinus rhythm with rate 83 bpm, no significant ST or T-wave changes  Other past medical history On his prior clinic visit, HCTZ was held, he was started on diltiazem.  Previous lab work showing total cholesterol 210  On his prior admission to the hospital, He was found to have heart rate of 160, systolic pressure 220. Troponin climbed to 0.3 felt secondary to rate-related event. His HCTZ was held, potassium was repleted. This was initially 3.2 with mildly elevated creatinine 1.31. He was started on diltiazem 120 mg daily. It was felt that his SVT was secondary to his low potassium.  Prior CT scan in the hospital showed no PE, otherwise normal CT of the chest    Allergies  Allergen Reactions  . No Known Allergies     No current outpatient prescriptions on file prior to visit.   No current facility-administered medications on file prior to visit.    Past Medical History  Diagnosis Date  . SVT (supraventricular tachycardia) (HCC)   . Hypertension   . Hyperlipidemia   . Hypokalemia   . Palpitations     Past Surgical History  Procedure Laterality Date  . Tonsillectomy       Social History  reports that he has quit smoking. He does not have any smokeless tobacco history on file. He reports that he does not drink alcohol or use illicit drugs.  Family History family history includes Heart attack (age of onset: 8) in his mother; Hyperlipidemia in his father; Hypertension in his father and sister.                 Review of Systems  Constitutional: Positive for unexpected weight change.  Respiratory: Positive for shortness of breath.   Cardiovascular: Negative.   Gastrointestinal: Negative.   Musculoskeletal: Negative.   Neurological: Negative.   Hematological: Negative.   Psychiatric/Behavioral: Negative.   All other systems reviewed and are negative.   BP 186/125 mmHg  Pulse 83  Ht  (1.778 m)  Wt 241 lb 12 oz (109.657 kg)  BMI 34.69 kg/m2  Physical Exam  Constitutional: He is oriented to person, place, and time. He appears well-developed and well-nourished.  HENT:  Head: Normocephalic.  Nose: Nose normal.  Mouth/Throat: Oropharynx is clear and moist.  Eyes: Conjunctivae are normal. Pupils are equal, round, and reactive to light.  Neck: Normal range of motion. Neck supple. No JVD present.  Cardiovascular: Normal rate, regular rhythm, S1 normal, S2 normal, normal heart sounds and intact distal pulses.  Exam reveals no gallop and no friction rub.   No murmur heard. Pulmonary/Chest: Effort normal and breath sounds normal. No respiratory distress. He has no wheezes. He has  no rales. He exhibits no tenderness.  Abdominal: Soft. Bowel sounds are normal. He exhibits no distension. There is no tenderness.  Musculoskeletal: Normal range of motion. He exhibits no edema or tenderness.  Lymphadenopathy:    He has no cervical adenopathy.  Neurological: He is alert and oriented to person, place, and time. Coordination normal.  Skin: Skin is warm and dry. No rash noted. No erythema.  Psychiatric: He has a normal mood and affect. His behavior is  normal. Judgment and thought content normal.      Assessment and Plan   Nursing note and vitals reviewed.

## 2015-05-10 NOTE — Assessment & Plan Note (Signed)
We will restart Lipitor but increase dose up to 20 mg daily as prior cholesterol was still 210

## 2015-05-10 NOTE — Assessment & Plan Note (Signed)
Recommended he start diltiazem 240 mg daily as he was taking previously. Recommended he monitor his blood pressure at home and call our office if this continues to run high. Higher dose could be used if needed

## 2015-05-10 NOTE — Assessment & Plan Note (Signed)
We have encouraged continued exercise, careful diet management in an effort to lose weight. 

## 2015-05-10 NOTE — Assessment & Plan Note (Signed)
I suspect his shortness of breath is secondary to obesity, deconditioning and high blood pressure No further workup at this time

## 2015-05-10 NOTE — Patient Instructions (Addendum)
You are doing well.  Blood pressure is elevated  Please restart diltiazem and lipitor daily  Monitor your blood pressure, Call the office if it runs high  Please call us if you have new issues that need to be addressed before your next appt.  Your physician wants you to follow-up in: 12 months.  You will receive a reminder letter in the mail two months in advance. If you don't receive a letter, please call our office to schedule the follow-up appointment.

## 2015-05-10 NOTE — Assessment & Plan Note (Signed)
He denies any tachycardia concerning for arrhythmia. We'll restart his diltiazem 240 mg daily

## 2016-01-14 ENCOUNTER — Encounter: Payer: Self-pay | Admitting: *Deleted

## 2016-01-15 ENCOUNTER — Ambulatory Visit: Admit: 2016-01-15 | Payer: Self-pay | Admitting: Gastroenterology

## 2016-01-15 ENCOUNTER — Ambulatory Visit
Admission: RE | Admit: 2016-01-15 | Discharge: 2016-01-15 | Disposition: A | Discharge status: Home / Self Care | Payer: BLUE CROSS/BLUE SHIELD | Source: Ambulatory Visit | Attending: Gastroenterology | Admitting: Gastroenterology

## 2016-01-15 ENCOUNTER — Encounter
Admission: RE | Disposition: A | Discharge status: Home / Self Care | Payer: Self-pay | Source: Ambulatory Visit | Attending: Gastroenterology

## 2016-01-15 ENCOUNTER — Encounter: Payer: Self-pay | Admitting: Anesthesiology

## 2016-01-15 ENCOUNTER — Ambulatory Visit: Payer: BLUE CROSS/BLUE SHIELD | Admitting: Anesthesiology

## 2016-01-15 DIAGNOSIS — Z79899 Other long term (current) drug therapy: Secondary | ICD-10-CM | POA: Diagnosis not present

## 2016-01-15 DIAGNOSIS — E876 Hypokalemia: Secondary | ICD-10-CM | POA: Diagnosis not present

## 2016-01-15 DIAGNOSIS — Z87891 Personal history of nicotine dependence: Secondary | ICD-10-CM | POA: Insufficient documentation

## 2016-01-15 DIAGNOSIS — K5730 Diverticulosis of large intestine without perforation or abscess without bleeding: Secondary | ICD-10-CM | POA: Insufficient documentation

## 2016-01-15 DIAGNOSIS — Z7982 Long term (current) use of aspirin: Secondary | ICD-10-CM | POA: Diagnosis not present

## 2016-01-15 DIAGNOSIS — I10 Essential (primary) hypertension: Secondary | ICD-10-CM | POA: Insufficient documentation

## 2016-01-15 DIAGNOSIS — K635 Polyp of colon: Secondary | ICD-10-CM | POA: Diagnosis not present

## 2016-01-15 DIAGNOSIS — E785 Hyperlipidemia, unspecified: Secondary | ICD-10-CM | POA: Insufficient documentation

## 2016-01-15 DIAGNOSIS — Z1211 Encounter for screening for malignant neoplasm of colon: Secondary | ICD-10-CM | POA: Insufficient documentation

## 2016-01-15 DIAGNOSIS — I471 Supraventricular tachycardia: Secondary | ICD-10-CM | POA: Insufficient documentation

## 2016-01-15 HISTORY — PX: COLONOSCOPY WITH PROPOFOL: SHX5780

## 2016-01-15 HISTORY — DX: Cardiac arrhythmia, unspecified: I49.9

## 2016-01-15 SURGERY — COLONOSCOPY WITH PROPOFOL
Anesthesia: General

## 2016-01-15 MED ORDER — PHENYLEPHRINE HCL 10 MG/ML IJ SOLN
INTRAMUSCULAR | Status: DC | PRN
  Administered 2016-01-15: 100 ug via INTRAVENOUS

## 2016-01-15 MED ORDER — FENTANYL CITRATE (PF) 100 MCG/2ML IJ SOLN
INTRAMUSCULAR | Status: DC | PRN
  Administered 2016-01-15: 50 ug via INTRAVENOUS

## 2016-01-15 MED ORDER — MIDAZOLAM HCL 5 MG/5ML IJ SOLN
INTRAMUSCULAR | Status: DC | PRN
  Administered 2016-01-15: 1 mg via INTRAVENOUS

## 2016-01-15 MED ORDER — SODIUM CHLORIDE 0.9 % IV SOLN
INTRAVENOUS | Status: DC | PRN
  Administered 2016-01-15: 14:00:00 via INTRAVENOUS

## 2016-01-15 MED ORDER — SODIUM CHLORIDE 0.9 % IV SOLN: INTRAVENOUS | Status: DC

## 2016-01-15 MED ORDER — LIDOCAINE 2% (20 MG/ML) 5 ML SYRINGE
INTRAMUSCULAR | Status: DC | PRN
  Administered 2016-01-15: 40 mg via INTRAVENOUS

## 2016-01-15 MED ORDER — PROPOFOL 500 MG/50ML IV EMUL
INTRAVENOUS | Status: DC | PRN
  Administered 2016-01-15: 180 ug/kg/min via INTRAVENOUS

## 2016-01-15 MED ORDER — PROPOFOL 10 MG/ML IV BOLUS
INTRAVENOUS | Status: DC | PRN
  Administered 2016-01-15: 100 mg via INTRAVENOUS
  Administered 2016-01-15: 50 mg via INTRAVENOUS

## 2016-01-15 NOTE — Anesthesia Postprocedure Evaluation (Signed)
Anesthesia Post Note  Patient: Anthony [name]  Procedure(s) Performed: Procedure(s) (LRB): COLONOSCOPY WITH PROPOFOL (N/A)  Patient location during evaluation: Endoscopy Anesthesia Type: General Level of consciousness: awake and alert Pain management: pain level controlled Vital Signs Assessment: post-procedure vital signs reviewed and stable Respiratory status: spontaneous breathing, nonlabored ventilation, respiratory function stable and patient connected to nasal cannula oxygen Cardiovascular status: blood pressure returned to baseline and stable Postop Assessment: no signs of nausea or vomiting Anesthetic complications: no    Last Vitals:  Filed Vitals:   01/15/16 1530 01/15/16 1540  BP: 136/99 120/89  Pulse: 83 73  Temp:    Resp: 16 12    Last Pain: There were no vitals filed for this visit.               Seiya Silsby S

## 2016-01-15 NOTE — Transfer of Care (Signed)
Immediate Anesthesia Transfer of Care Note  Patient: Anthony [name]  Procedure(s) Performed: Procedure(s): COLONOSCOPY WITH PROPOFOL (N/A)  Patient Location: PACU and Endoscopy Unit  Anesthesia Type:General  Level of Consciousness: sedated  Airway & Oxygen Therapy: Patient Spontanous Breathing and Patient connected to nasal cannula oxygen  Post-op Assessment: Report given to RN and Post -op Vital signs reviewed and stable  Post vital signs: Reviewed and stable  Last Vitals:  Filed Vitals:   01/15/16 1333  BP: 172/99  Pulse: 111  Temp: 35.9 C  Resp: 20    Last Pain: There were no vitals filed for this visit.       Complications: No apparent anesthesia complications

## 2016-01-15 NOTE — H&P (Signed)
Outpatient short stay form Pre-procedure 01/15/2016 2:22 PM Anthony [name] [name] MD  Primary Physician: Dr. Tarri AbernethyJoseph Rabinowitz  Reason for visit:  Screening colonoscopy  History of present illness:  Patient is a 60 year old male presenting today for colonoscopy. He's never had a colonoscopy before. There is no family history of colon cancer colon polyps. He does take 81 mg aspirin which she has held for about 6 days. He takes no other aspirin products or blood thinning agents.  Current facility-administered medications:  .  0.9 %  sodium chloride infusion, , Intravenous, Continuous, Anthony [name] Ercole Georg, MD  Facility-Administered Medications Ordered in Other Encounters:  .  0.9 %  sodium chloride infusion, , Intravenous, Continuous PRN, Charna Busmanhomas Diamond, CRNA  Prescriptions prior to admission  Medication Sig Dispense Refill Last Dose  . atorvastatin (LIPITOR) 20 MG tablet Take 1 tablet (20 mg total) by mouth daily. 90 tablet 4 01/14/2016 at Unknown time  . diltiazem (CARDIZEM CD) 240 MG 24 hr capsule Take 1 capsule (240 mg total) by mouth daily. 90 capsule 3 01/14/2016 at Unknown time  . TESTOSTERONE IM Inject into the muscle.   Past Week at Unknown time     Allergies  Allergen Reactions  . No Known Allergies      Past Medical History  Diagnosis Date  . SVT (supraventricular tachycardia) (HCC)   . Hypertension   . Hyperlipidemia   . Hypokalemia   . Palpitations   . Dysrhythmia     Review of systems:      Physical Exam    Heart and lungs: Regular rate and rhythm without rub or gallop, lungs are bilaterally clear.    HEENT: Normocephalic atraumatic eyes are anicteric    Other:     Pertinant exam for procedure: Soft nontender nondistended bowel sounds positive normoactive.    Planned proceedures: Colonoscopy and indicated procedures. I have discussed the risks benefits and complications of procedures to include not limited to bleeding, infection, perforation and the risk of  sedation and the patient wishes to proceed.    Anthony [name] [name], MD Gastroenterology 01/15/2016  2:22 PM

## 2016-01-15 NOTE — Anesthesia Preprocedure Evaluation (Signed)
Anesthesia Evaluation  Patient identified by MRN, date of birth, ID band Patient awake    Reviewed: Allergy & Precautions, NPO status , Patient's Chart, lab work & pertinent test results, reviewed documented beta blocker date and time   Airway Mallampati: II  TM Distance: >3 FB     Dental  (+) Chipped   Pulmonary shortness of breath, former smoker,           Cardiovascular hypertension, Pt. on medications + dysrhythmias Supra Ventricular Tachycardia      Neuro/Psych    GI/Hepatic   Endo/Other    Renal/GU      Musculoskeletal   Abdominal   Peds  Hematology   Anesthesia Other Findings   Reproductive/Obstetrics                             Anesthesia Physical Anesthesia Plan  ASA: III  Anesthesia Plan: General   Post-op Pain Management:    Induction: Intravenous  Airway Management Planned: Nasal Cannula  Additional Equipment:   Intra-op Plan:   Post-operative Plan:   Informed Consent: I have reviewed the patients History and Physical, chart, labs and discussed the procedure including the risks, benefits and alternatives for the proposed anesthesia with the patient or authorized representative who has indicated his/her understanding and acceptance.     Plan Discussed with: CRNA  Anesthesia Plan Comments:         Anesthesia Quick Evaluation

## 2016-01-15 NOTE — Op Note (Signed)
[facility] Gastroenterology Patient Name: Anthony [name] Procedure Date: 01/15/2016 2:30 PM MRN: [id number] Account #: [id number] Date of Birth: 11/06/1955 Admit Type: Outpatient Age: [age] Room: [facility] ENDO ROOM 3 Gender: Male Note Status: Finalized Procedure:            Colonoscopy Indications:          Screening for colorectal malignant neoplasm, This is                        the patient's first colonoscopy Providers:            Christena DeemMartin U. Skulskie, MD Referring MD:         Dr. Tarri AbernethyJoseph Rabinowitz Medicines:            Monitored Anesthesia Care Complications:        No immediate complications. Procedure:            Pre-Anesthesia Assessment:                       - ASA Grade Assessment: II - A patient with mild                        systemic disease.                       After obtaining informed consent, the colonoscope was                        passed under direct vision. Throughout the procedure,                        the patient's blood pressure, pulse, and oxygen                        saturations were monitored continuously. The                        Colonoscope was introduced through the anus and                        advanced to the the cecum, identified by appendiceal                        orifice and ileocecal valve. The colonoscopy was                        performed with moderate difficulty. The patient                        tolerated the procedure well. The quality of the bowel                        preparation was good. Findings:      Three sessile polyps were found in the descending colon. The polyps were       1 to 2 mm in size. These polyps were removed with a cold biopsy forceps.       Resection and retrieval were complete.      Multiple small to medium diverticula were found in the sigmoid colon,       descending colon, transverse colon and ascending colon.  The retroflexed view of the distal rectum and anal verge was normal and   showed no anal or rectal abnormalities.      The digital rectal exam was normal. Impression:           - Three 1 to 2 mm polyps in the descending colon,                        removed with a cold biopsy forceps. Resected and                        retrieved.                       - Diverticulosis in the sigmoid colon, in the                        descending colon, in the transverse colon and in the                        ascending colon.                       - The distal rectum and anal verge are normal on                        retroflexion view. Recommendation:       - Discharge patient to home.                       - Await pathology results.                       - Telephone GI clinic for pathology results in 1 week. Procedure Code(s):    --- Professional ---                       [phone], Colonoscopy, flexible; with biopsy, single or                        multiple Diagnosis Code(s):    --- Professional ---                       Z12.11, Encounter for screening for malignant neoplasm                        of colon                       D12.4, Benign neoplasm of descending colon                       K57.30, Diverticulosis of large intestine without                        perforation or abscess without bleeding CPT copyright 2016 [employer]. All rights reserved. The codes documented in this report are preliminary and upon coder review may  be revised to meet current compliance requirements. Christena Deem, MD 01/15/2016 3:04:51 PM This report has been signed electronically. Number of Addenda: 0 Note Initiated On: 01/15/2016 2:30 PM Scope Withdrawal Time: 0 hours 7 minutes 8 seconds  Total Procedure Duration: 0 hours 23 minutes  1 second       [facility]

## 2016-01-16 ENCOUNTER — Encounter: Payer: Self-pay | Admitting: Gastroenterology

## 2016-01-17 LAB — SURGICAL PATHOLOGY

## 2016-03-04 ENCOUNTER — Other Ambulatory Visit: Payer: Self-pay | Admitting: Physician Assistant

## 2016-03-04 NOTE — Telephone Encounter (Signed)
Is he your pt?  Thanks,   -Vernona Rieger

## 2016-03-24 ENCOUNTER — Other Ambulatory Visit: Payer: Self-pay | Admitting: Physician Assistant

## 2016-05-02 ENCOUNTER — Other Ambulatory Visit: Payer: Self-pay | Admitting: Cardiovascular Disease

## 2016-06-30 ENCOUNTER — Ambulatory Visit (INDEPENDENT_AMBULATORY_CARE_PROVIDER_SITE_OTHER): Payer: BLUE CROSS/BLUE SHIELD | Admitting: Cardiovascular Disease

## 2016-06-30 ENCOUNTER — Encounter: Payer: Self-pay | Admitting: Cardiovascular Disease

## 2016-06-30 VITALS — BP 130/98 | HR 85 | Ht 70.0 in | Wt 233.5 lb

## 2016-06-30 DIAGNOSIS — E7800 Pure hypercholesterolemia, unspecified: Secondary | ICD-10-CM

## 2016-06-30 DIAGNOSIS — I471 Supraventricular tachycardia: Secondary | ICD-10-CM | POA: Diagnosis not present

## 2016-06-30 DIAGNOSIS — I158 Other secondary hypertension: Secondary | ICD-10-CM | POA: Diagnosis not present

## 2016-06-30 NOTE — Patient Instructions (Signed)

## 2016-06-30 NOTE — Progress Notes (Signed)
Cardiology Office Note  Date:  06/30/2016   ID:  Anthony [name], DOB 04/26/1956, MRN [id number]  PCP:  Fulton ReekJames D Strickland, MD   Chief Complaint  Patient presents with  . other    12 month follow up. Meds reviewed by the pt. verbally. 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  Previous lab work showing total cholesterol 210  On his prior admission to the hospital, He was found to have heart rate of 160, systolic pressure 220. Troponin climbed to 0.3 felt secondary to rate-related event. His HCTZ was held, potassium was repleted. This was initially 3.2 with mildly elevated creatinine 1.31. He was started on diltiazem 120 mg daily. It was felt that his SVT was secondary to his low potassium.  Prior CT scan in the hospital showed no PE, otherwise normal CT of the chest   PMH:   has a past medical history of Dysrhythmia; Hyperlipidemia; Hypertension; Hypokalemia; Palpitations; and SVT (supraventricular tachycardia) (HCC).  PSH:    Past Surgical History:  Procedure Laterality Date  . COLONOSCOPY WITH PROPOFOL N/A 01/15/2016   Procedure: COLONOSCOPY WITH PROPOFOL;   Surgeon: Christena DeemMartin U Skulskie, MD;  [facility];  Service: Endoscopy;  Laterality: N/A;  . TONSILLECTOMY      Current Outpatient Prescriptions  Medication Sig Dispense Refill  . atorvastatin (LIPITOR) 20 MG tablet Take 1 tablet (20 mg total) by mouth daily. 90 tablet 4  . diltiazem (CARDIZEM CD) 240 MG 24 hr capsule TAKE 1 CAPSULE (240 MG TOTAL) BY MOUTH DAILY. 90 capsule 1  . Testosterone (ANDROGEL) 40.5 MG/2.5GM (1.62%) GEL Place 2 application onto the skin daily.     No current facility-administered medications for this visit.      Allergies:   No known allergies   Social History:  The patient  reports that he has quit smoking. He has never used smokeless tobacco. He reports that he does not drink alcohol or use drugs.   Family History:   family history includes Heart attack (age of onset: 1780) in his mother; Hyperlipidemia in his father; Hypertension in his father and sister.    Review of Systems: Review of Systems  Constitutional: Negative.   Respiratory: Negative.   Cardiovascular: Negative.   Gastrointestinal: Negative.   Musculoskeletal: Negative.   Neurological: Negative.   Psychiatric/Behavioral: Negative.   All other systems reviewed and are negative.    PHYSICAL EXAM: VS:  BP (!) 130/98 (BP Location: Left Arm, Patient Position: Sitting, Cuff Size: Normal)   Pulse 85   Ht 5\' 10"  (1.778 m)   Wt 233 lb 8 oz (105.9 kg)   BMI 33.50 kg/m  , BMI Body mass index  is 33.5 kg/m. GEN: Well nourished, well developed, in no acute distress  HEENT: normal  Neck: no JVD, carotid bruits, or masses Cardiac: RRR; no murmurs, rubs, or gallops,no edema  Respiratory:  clear to auscultation bilaterally, normal work of breathing GI: soft, nontender, nondistended, + BS MS: no deformity or atrophy  Skin: warm and dry, no rash Neuro:  Strength and sensation are intact Psych: euthymic mood, full affect    Recent Labs: No results found for requested labs within last  8760 hours.    Lipid Panel No results found for: CHOL, HDL, LDLCALC, TRIG    Wt Readings from Last 3 Encounters:  06/30/16 233 lb 8 oz (105.9 kg)  01/15/16 236 lb (107 kg)  05/10/15 241 lb 12 oz (109.7 kg)       ASSESSMENT AND PLAN:  Pure hypercholesterolemia We'll try to obtain his lipid results from primary care Encouraged him to stay on his Lipitor  Other secondary hypertension - Plan: EKG 12-Lead Diastolic pressures elevated today but he did not take his morning medication Recommended that he check his blood pressure at home Call our office if diastolic pressure continues to run above 90  SVT (supraventricular tachycardia) (HCC) - Plan: EKG 12-Lead Denies any arrhythmia Continue diltiazem   Total encounter time more than 15 minutes  Greater than 50% was spent in counseling and coordination of care with the patient  Disposition:   F/U  12 months   Orders Placed This Encounter  Procedures  . EKG 12-Lead     Signed, Dossie Arbourim Tasheema Perrone, M.D., Ph.D. 06/30/2016  [facility] [place] [phone]

## 2016-08-09 ENCOUNTER — Other Ambulatory Visit: Payer: Self-pay | Admitting: Cardiovascular Disease

## 2016-11-11 ENCOUNTER — Other Ambulatory Visit: Payer: Self-pay | Admitting: Cardiovascular Disease

## 2018-11-24 ENCOUNTER — Telehealth: Payer: Self-pay

## 2018-11-24 NOTE — Telephone Encounter (Signed)
Virtual Visit Pre-Appointment Phone Call  "(Name), I am calling you today to discuss your upcoming appointment. We are currently trying to limit exposure to the virus that causes COVID-19 by seeing patients at home rather than in the office."  1. "What is the BEST phone number to call the day of the visit?" - include this in appointment notes  2. "Do you have or have access to (through a family member/friend) a smartphone with video capability that we can use for your visit?" a. If yes - list this number in appt notes as "cell" (if different from BEST phone #) and list the appointment type as a VIDEO visit in appointment notes b. If no - list the appointment type as a PHONE visit in appointment notes  3. Confirm consent - "In the setting of the current Covid19 crisis, you are scheduled for a TELEPHONE visit with your provider on 12/03/2018 at 10:40AM.  Just as we do with many in-office visits, in order for you to participate in this visit, we must obtain consent.  If you'd like, I can send this to your mychart (if signed up) or email for you to review.  Otherwise, I can obtain your verbal consent now.  All virtual visits are billed to your insurance company just like a normal visit would be.  By agreeing to a virtual visit, we'd like you to understand that the technology does not allow for your provider to perform an examination, and thus may limit your provider's ability to fully assess your condition. If your provider identifies any concerns that need to be evaluated in person, we will make arrangements to do so.  Finally, though the technology is pretty good, we cannot assure that it will always work on either your or our end, and in the setting of a video visit, we may have to convert it to a phone-only visit.  In either situation, we cannot ensure that we have a secure connection.  Are you willing to proceed?" STAFF: Did the patient verbally acknowledge consent to telehealth visit? Document  YES/NO here: yes  4. Advise patient to be prepared - "Two hours prior to your appointment, go ahead and check your blood pressure, pulse, oxygen saturation, and your weight (if you have the equipment to check those) and write them all down. When your visit starts, your provider will ask you for this information. If you have an Apple Watch or Kardia device, please plan to have heart rate information ready on the day of your appointment. Please have a pen and paper handy nearby the day of the visit as well."  5. Give patient instructions for MyChart download to smartphone OR [email or web address] as below if video visit (depending on what platform provider is using)  6. Inform patient they will receive a phone call 15 minutes prior to their appointment time (may be from unknown caller ID) so they should be prepared to answer    TELEPHONE CALL NOTE  Anthony [name] has been deemed a candidate for a follow-up tele-health visit to limit community exposure during the Covid-19 pandemic. I spoke with the patient via phone to ensure availability of phone/video source, confirm preferred email & phone number, and discuss instructions and expectations.  I reminded Evart H Fullard to be prepared with any vital sign and/or heart rhythm information that could potentially be obtained via home monitoring, at the time of his visit. I reminded LAMONTEZ KLEIST to expect a phone call prior to his visit.  Margrett RudBrittany N Burnette Valenti, [place] 11/24/2018 3:28 PM   INSTRUCTIONS FOR DOWNLOADING THE MYCHART APP TO SMARTPHONE  - The patient must first make sure to have activated MyChart and know their login information - If Apple, go to [employer] Store and type in MyChart in the search bar and download the app. If Android, ask patient to go to [employer] Play Store and type in [place]Chart in the search bar and download the app. The app is free but as with any other app downloads, their phone may require them to verify saved payment information or  Apple/Android password.  - The patient will need to then log into the app with their MyChart username and password, and select [facility] as their healthcare provider to link the account. When it is time for your visit, go to the MyChart app, find appointments, and click Begin Video Visit. Be sure to Select Allow for your device to access the Microphone and Camera for your visit. You will then be connected, and your provider will be with you shortly.  **If they have any issues connecting, or need assistance please contact MyChart service desk [phone])**  **If using a computer, in order to ensure the best quality for their visit they will need to use either of the following Internet Browsers: [employer] Edge, or Google Chrome**  IF USING DOXIMITY or DOXY.ME - The patient will receive a link just prior to their visit by text.     FULL LENGTH CONSENT FOR TELE-HEALTH VISIT   I hereby voluntarily request, consent and authorize [facility] and its employed or contracted physicians, physician assistants, nurse practitioners or other licensed health care professionals (the Practitioner), to provide me with telemedicine health care services (the "Services") as deemed necessary by the treating Practitioner. I acknowledge and consent to receive the Services by the Practitioner via telemedicine. I understand that the telemedicine visit will involve communicating with the Practitioner through live audiovisual communication technology and the disclosure of certain medical information by electronic transmission. I acknowledge that I have been given the opportunity to request an in-person assessment or other available alternative prior to the telemedicine visit and am voluntarily participating in the telemedicine visit.  I understand that I have the right to withhold or withdraw my consent to the use of telemedicine in the course of my care at any time, without affecting my right to future care  or treatment, and that the Practitioner or I may terminate the telemedicine visit at any time. I understand that I have the right to inspect all information obtained and/or recorded in the course of the telemedicine visit and may receive copies of available information for a reasonable fee.  I understand that some of the potential risks of receiving the Services via telemedicine include:  Marland Kitchen. Delay or interruption in medical evaluation due to technological equipment failure or disruption; . Information transmitted may not be sufficient (e.g. poor resolution of images) to allow for appropriate medical decision making by the Practitioner; and/or  . In rare instances, security protocols could fail, causing a breach of personal health information.  Furthermore, I acknowledge that it is my responsibility to provide information about my medical history, conditions and care that is complete and accurate to the best of my ability. I acknowledge that Practitioner's advice, recommendations, and/or decision may be based on factors not within their control, such as incomplete or inaccurate data provided by me or distortions of diagnostic images or specimens that may result from electronic transmissions. I understand that  the practice of medicine is not an exact science and that Practitioner makes no warranties or guarantees regarding treatment outcomes. I acknowledge that I will receive a copy of this consent concurrently upon execution via email to the email address I last provided but may also request a printed copy by calling the office of [place].    I understand that my insurance will be billed for this visit.   I have read or had this consent read to me. . I understand the contents of this consent, which adequately explains the benefits and risks of the Services being provided via telemedicine.  . I have been provided ample opportunity to ask questions regarding this consent and the Services and have had  my questions answered to my satisfaction. . I give my informed consent for the services to be provided through the use of telemedicine in my medical care  By participating in this telemedicine visit I agree to the above.

## 2018-12-02 NOTE — Progress Notes (Signed)
Virtual Visit via Video Note   This visit type was conducted due to national recommendations for restrictions regarding the COVID-19 Pandemic (e.g. social distancing) in an effort to limit this patient's exposure and mitigate transmission in our community.  Due to her co-morbid illnesses, this patient is at least at moderate risk for complications without adequate follow up.  This format is felt to be most appropriate for this patient at this time.  All issues noted in this document were discussed and addressed.  A limited physical exam was performed with this format.  Please refer to the patient's chart for her consent to telehealth for [facility].    Evaluation Performed:  Follow-up visit  Date:  12/03/2018   ID:  Anthony [name], DOB 01/04/1956, MRN [id number]  [place]  [address] [place] [id number]   [facility] location[facility]   [facility] [place] [facility]  PCP:  Jabier MuttonStrickland, James, MD  Cardiologist:  Fonnie MuGollan, [facility]   Chief Complaint: Follow-up of SVT, high cholesterol    History of Present Illness:    Anthony [name] is a 63 y.o. male who presents via audio/video conferencing for a telehealth visit today.   The patient does not symptoms concerning for COVID-19 infection (fever, chills, cough, or new SHORTNESS OF BREATH).   Patient has a past medical history of hypertension with admission to the hospital on 04/30/2013 for narrow complex tachycardia concerning for SVT, hypokalemia, severe hypertension, who presents for routine followup of his hypertension , SVT  Retired, 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  Previous lab work showing total cholesterol 210  On his prior admission to the hospital, He was found to have heart rate of 160, systolic pressure 220. Troponin climbed to 0.3 felt secondary to rate-related event. His HCTZ was held, potassium was repleted. This was initially 3.2 with mildly elevated creatinine 1.31. He was started on diltiazem 120 mg daily. It was felt that his SVT was secondary to his low potassium.  Prior CT scan in the hospital showed no PE, otherwise normal CT of the chest   Prior CV studies:   The following studies were reviewed today:    Past Medical History:  Diagnosis Date  . Dysrhythmia   . Hyperlipidemia   . Hypertension   . Hypokalemia   . Palpitations   . SVT (supraventricular tachycardia) (HCC)    Past Surgical History:  Procedure Laterality Date  . COLONOSCOPY WITH PROPOFOL N/A 01/15/2016   Procedure: COLONOSCOPY WITH PROPOFOL;  Surgeon: Christena DeemMartin U Skulskie, MD;  [facility];  Service: Endoscopy;  Laterality: N/A;  . TONSILLECTOMY       No outpatient medications have been marked as taking for the  12/03/18 encounter (Telemedicine) with Antonieta Iba, MD.     Allergies:   No known allergies   Social History   Tobacco Use  . Smoking status: Former Games developer  . Smokeless tobacco: Never Used  Substance Use Topics  . Alcohol use: No  . Drug use: No     Current Outpatient Medications on File Prior to Visit  Medication Sig Dispense Refill  . atorvastatin (LIPITOR) 20 MG tablet TAKE 1 TABLET (20 MG TOTAL) BY MOUTH  DAILY. (Patient not taking: Reported on 12/03/2018) 90 tablet 3  . diltiazem (CARDIZEM CD) 240 MG 24 hr capsule TAKE 1 CAPSULE (240 MG TOTAL) BY MOUTH DAILY. (Patient not taking: Reported on 12/03/2018) 90 capsule 2  . Testosterone (ANDROGEL) 40.5 MG/2.5GM (1.62%) GEL Place 2 application onto the skin daily.     No current facility-administered medications on file prior to visit.      Family Hx: The patient's family history includes Heart attack (age of onset: 87) in his mother; Hyperlipidemia in his father; Hypertension in his father and sister.  ROS:   Please see the history of present illness.    Review of Systems  Constitutional: Negative.   Respiratory: Negative.   Cardiovascular: Negative.   Gastrointestinal: Negative.   Musculoskeletal: Negative.   Neurological: Negative.   Psychiatric/Behavioral: Negative.   All other systems reviewed and are negative.    Labs/Other Tests and Data Reviewed:    Recent Labs: No results found for requested labs within last 8760 hours.   Recent Lipid Panel No results found for: CHOL, TRIG, HDL, CHOLHDL, LDLCALC, LDLDIRECT  Wt Readings from Last 3 Encounters:  06/30/16 233 lb 8 oz (105.9 kg)  01/15/16 236 lb (107 kg)  05/10/15 241 lb 12 oz (109.7 kg)     Exam:    Vital Signs: Vital signs may also be detailed in the HPI There were no vitals taken for this visit.  Wt Readings from Last 3 Encounters:  06/30/16 233 lb 8 oz (105.9 kg)  01/15/16 236 lb (107 kg)  05/10/15 241 lb 12 oz (109.7 kg)   Temp Readings from Last 3 Encounters:  01/15/16 (!) 96.8 F (36 C) (Tympanic)   BP Readings from Last 3 Encounters:  06/30/16 (!) 130/98  01/15/16 120/89  05/10/15 (!) 186/125   Pulse Readings from Last 3 Encounters:  06/30/16 85  01/15/16 73  05/10/15 83     130/80,  Pulse 60s, respirations 16  Well nourished, well developed male in no acute distress. Constitutional:  oriented to person, place, and time. No distress.      ASSESSMENT & PLAN:    SVT (supraventricular tachycardia) (HCC) Denies any significant arrhythmia Not on diltiazem  Other secondary hypertension Reports blood pressure stable, Suggested he monitor blood pressures at home  Pure hypercholesterolemia Previous cholesterol in the 200 range Discussed risk stratification options for him He is interested in CT coronary calcium scoring for risk stratification We will order this for him and discuss results when they become available   COVID-19 Education: The signs and symptoms of COVID-19 were discussed with the patient and how to seek care for testing (follow up with PCP or arrange E-visit).  The importance of social distancing was discussed today.  Patient Risk:   After full review of this patients clinical status, I feel that they are at least moderate risk at this time.  Time:   Today, I have spent 25 minutes with the patient with telehealth technology discussing the cardiac and medical  problems/diagnoses detailed above   10 min spent reviewing the chart prior to patient visit today   Medication Adjustments/Labs and Tests Ordered: Current medicines are reviewed at length with the patient today.  Concerns regarding medicines are outlined above.   Tests Ordered: CT coronary calcium scoring  Medication Changes: No changes made   Disposition: Follow-up as needed   Signed, Julien Nordmann, MD  12/03/2018 10:57 AM    [facility] [address], [place], [place] [address]

## 2018-12-03 ENCOUNTER — Other Ambulatory Visit: Payer: Self-pay

## 2018-12-03 ENCOUNTER — Telehealth (INDEPENDENT_AMBULATORY_CARE_PROVIDER_SITE_OTHER): Payer: BLUE CROSS/BLUE SHIELD | Admitting: Cardiovascular Disease

## 2018-12-03 DIAGNOSIS — I158 Other secondary hypertension: Secondary | ICD-10-CM

## 2018-12-03 DIAGNOSIS — I471 Supraventricular tachycardia, unspecified: Secondary | ICD-10-CM

## 2018-12-03 DIAGNOSIS — E7800 Pure hypercholesterolemia, unspecified: Secondary | ICD-10-CM

## 2018-12-03 DIAGNOSIS — I10 Essential (primary) hypertension: Secondary | ICD-10-CM

## 2018-12-03 NOTE — Patient Instructions (Addendum)
Medication Instructions:  No changes  If you need a refill on your cardiac medications before your next appointment, please call your pharmacy.    Lab work: No new labs needed   If you have labs (blood work) drawn today and your tests are completely normal, you will receive your results only by: Marland Kitchen MyChart Message (if you have MyChart) OR . A paper copy in the mail If you have any lab test that is abnormal or we need to change your treatment, we will call you to review the results.   Testing/Procedures: CT Cardiac Scoring $150.00 [address] 3rd floor [place] Call [phone] to schedule this to be done.   Follow-Up: At [facility], you and your health needs are our priority.  As part of our continuing mission to provide you with exceptional heart care, we have created designated Provider Care Teams.  These Care Teams include your primary Cardiologist (physician) and Advanced Practice Providers (APPs -  Physician Assistants and Nurse Practitioners) who all work together to provide you with the care you need, when you need it.  . You will need a follow up appointment as needed  . Providers on your designated Care Team:   . Nicolasa Ducking, NP . Eula Listen, PA-C . Marisue Ivan, PA-C  Any Other Special Instructions Will Be Listed Below (If Applicable).  For educational health videos Log in to : [email or web address] Or : [email or web address], password : [email or web address]

## 2019-02-07 ENCOUNTER — Telehealth: Payer: Self-pay | Admitting: *Deleted

## 2019-02-07 NOTE — Telephone Encounter (Signed)
Script Screening patients for COVID-19 and reviewing new operational procedures  Greeting - The reason I am calling is to share with you some new changes to our processes that are designed to help us keep everyone safe. Is now a good time to speak with you? Patient says "no' - ask them when you can call back and let them know it's important to do this prior to their appointment.  Patient says "yes" - Great, Deray the first thing I need to do is ask you some screening Questions.  1. To the best of your knowledge, have you been in close contact with any one with a confirmed diagnosis of COVID 19? o No - proceed to next question  2. Have you had any one or more of the following: fever, chills, cough, shortness of breath or any flu-like symptoms? o No - proceed to next question  3. Have you been diagnosed with or have a previous diagnosis of COVID 19? o No - proceed to next question  4. I am going to go over a few other symptoms with you. Please let me know if you are experiencing any of the following: . Ear, nose or throat discomfort . A sore throat . Headache . Muscle pain . Diarrhea . Loss of taste or smell o No - proceed to next question  Thank you for answering these questions. Please know we will ask you these questions or similar questions when you arrive for your appointment and again it's how we are keeping everyone safe. Also, to keep you safe, please use the provided hand sanitizer when you enter the building. [place], we are asking everyone in the building to wear a mask because they help us prevent the spread of germs. Do you have a mask of your own, if not, we are happy to provide one for you. The last thing I want to go over with you is the no visitor guidelines. This means no one can attend the appointment with you unless you need physical assistance. I understand this may be different from your past appointments and I know this may be difficult but please know if  someone is driving you we are happy to call them for you once your appointment is over.  [INSERT SITE SPECIFIC CHECK IN PROCEDURES]  Brode I've given you a lot of information, what questions do you have about what I've talked about today or your appointment tomorrow? 

## 2019-02-08 ENCOUNTER — Ambulatory Visit (INDEPENDENT_AMBULATORY_CARE_PROVIDER_SITE_OTHER)
Admission: RE | Admit: 2019-02-08 | Discharge: 2019-02-08 | Disposition: A | Discharge status: Home / Self Care | Payer: Self-pay | Source: Ambulatory Visit | Attending: Cardiovascular Disease | Admitting: Cardiovascular Disease

## 2019-02-08 ENCOUNTER — Other Ambulatory Visit: Payer: Self-pay

## 2019-02-08 DIAGNOSIS — E7800 Pure hypercholesterolemia, unspecified: Secondary | ICD-10-CM

## 2019-02-14 ENCOUNTER — Telehealth: Payer: Self-pay | Admitting: *Deleted

## 2019-02-14 DIAGNOSIS — E7800 Pure hypercholesterolemia, unspecified: Secondary | ICD-10-CM

## 2019-02-14 NOTE — Telephone Encounter (Signed)
No answer. No voicemail. 

## 2019-02-14 NOTE — Telephone Encounter (Signed)
-----   Message from Minna Merritts, MD sent at 02/13/2019 12:46 PM EDT ----- Ct coronary calcium score is mildly elevated, 60, Which is 71st percentile for his age Would consider starting crestor 10 mg daily to achieve goal t.chol <150 I think his chol is 210 Recheck chol and lipids in 3 months

## 2019-02-18 NOTE — Telephone Encounter (Signed)
Patient calling in to go over test results. Please advise patient

## 2019-02-21 NOTE — Telephone Encounter (Signed)
Patient calling to check the status of results Please call 

## 2019-02-22 MED ORDER — ROSUVASTATIN CALCIUM 10 MG PO TABS: 10.0000 mg | ORAL_TABLET | Freq: Every day | ORAL | 3 refills | Status: DC

## 2019-02-22 NOTE — Telephone Encounter (Signed)
Spoke with patient and reviewed results and recommendations by provider. He verbalized understanding and was agreeable with plan. Advised that I would send in his medication and would have someone reach out to him to assist with scheduling labs to be done. Instructed him to not eat or drink anything after midnight before labs except small sip of water with pills. He verbalized understanding with no further questions at this time. Labs ordered and medication sent into his pharmacy listed.

## 2019-05-15 ENCOUNTER — Other Ambulatory Visit: Payer: Self-pay | Admitting: Cardiovascular Disease

## 2019-05-16 NOTE — Telephone Encounter (Signed)
Pharmacy is requesting rosuvastatin 10MG  be changed to Simvastatin 40MG .   Alternative Requested: PT PAYING $ 30 FOR ROSUV. PT REQUESTING LOWER OUT-OF-POCKET COST. SEE ALTERNATIVES AND ASSOCIATED COST: SIMVA $10.  Please let me know if you are okay with this change.

## 2019-05-23 ENCOUNTER — Other Ambulatory Visit: Payer: Self-pay

## 2019-05-23 DIAGNOSIS — N529 Male erectile dysfunction, unspecified: Secondary | ICD-10-CM

## 2019-05-23 MED ORDER — SILDENAFIL CITRATE 20 MG PO TABS: ORAL_TABLET | ORAL | 2 refills | Status: DC

## 2019-05-25 ENCOUNTER — Other Ambulatory Visit: Payer: 59

## 2019-05-25 ENCOUNTER — Telehealth: Payer: Self-pay

## 2019-05-25 NOTE — Telephone Encounter (Signed)
Called [facility] on [address] to request the Rx for Sildenafil 20 mg tablets that was renewed 05/23/2019 by Randel Pigg, PAC to this location be transferred to [facility] [address] location at patient's request.  AMD

## 2019-06-08 ENCOUNTER — Ambulatory Visit: Payer: Self-pay

## 2019-06-23 ENCOUNTER — Other Ambulatory Visit: Payer: Self-pay | Admitting: *Deleted

## 2019-06-24 ENCOUNTER — Other Ambulatory Visit: Payer: Self-pay

## 2019-06-24 ENCOUNTER — Ambulatory Visit: Payer: Self-pay

## 2019-06-24 DIAGNOSIS — Z23 Encounter for immunization: Secondary | ICD-10-CM

## 2019-07-18 NOTE — Telephone Encounter (Signed)
Error

## 2019-07-20 NOTE — Telephone Encounter (Signed)
Left detailed voicemail message that patient can get this prescription filled using GoodRx much cheaper without insurance at [facility] or [facility]. Requested that he please give [place] a call back so we can discuss these other cost saving measures before switching medication.

## 2019-07-20 NOTE — Telephone Encounter (Signed)
Attempted to call patient and number has been disconnected. Patient can get this medication using GoodRx with no insurance at [facility] at [address]. Will try to reach patient at other numbers.

## 2019-08-17 ENCOUNTER — Encounter: Payer: Self-pay | Admitting: Registered Nurse

## 2019-08-17 ENCOUNTER — Other Ambulatory Visit: Payer: Self-pay

## 2019-08-17 ENCOUNTER — Ambulatory Visit: Payer: Self-pay | Admitting: Registered Nurse

## 2019-08-17 VITALS — BP 140/90 | HR 91 | Temp 98.0°F | Ht 70.0 in | Wt 233.8 lb

## 2019-08-17 DIAGNOSIS — H6121 Impacted cerumen, right ear: Secondary | ICD-10-CM

## 2019-08-17 NOTE — Patient Instructions (Signed)
Earwax Buildup, Adult The ears produce a substance called earwax that helps keep bacteria out of the ear and protects the skin in the ear canal. Occasionally, earwax can build up in the ear and cause discomfort or hearing loss. What increases the risk? This condition is more likely to develop in people who:  Are male.  Are elderly.  Naturally produce more earwax.  Clean their ears often with cotton swabs.  Use earplugs often.  Use in-ear headphones often.  Wear hearing aids.  Have narrow ear canals.  Have earwax that is overly thick or sticky.  Have eczema.  Are dehydrated.  Have excess hair in the ear canal. What are the signs or symptoms? Symptoms of this condition include:  Reduced or muffled hearing.  A feeling of fullness in the ear or feeling that the ear is plugged.  Fluid coming from the ear.  Ear pain.  Ear itch.  Ringing in the ear.  Coughing.  An obvious piece of earwax that can be seen inside the ear canal. How is this diagnosed? This condition may be diagnosed based on:  Your symptoms.  Your medical history.  An ear exam. During the exam, your health care provider will look into your ear with an instrument called an otoscope. You may have tests, including a hearing test. How is this treated? This condition may be treated by:  Using ear drops to soften the earwax.  Having the earwax removed by a health care provider. The health care provider may: ? Flush the ear with water. ? Use an instrument that has a loop on the end (curette). ? Use a suction device.  Surgery to remove the wax buildup. This may be done in severe cases. Follow these instructions at home:   Take over-the-counter and prescription medicines only as told by your health care provider.  Do not put any objects, including cotton swabs, into your ear. You can clean the opening of your ear canal with a washcloth or facial tissue.  Follow instructions from your health care  provider about cleaning your ears. Do not over-clean your ears.  Drink enough fluid to keep your urine clear or pale yellow. This will help to thin the earwax.  Keep all follow-up visits as told by your health care provider. If earwax builds up in your ears often or if you use hearing aids, consider seeing your health care provider for routine, preventive ear cleanings. Ask your health care provider how often you should schedule your cleanings.  If you have hearing aids, clean them according to instructions from the manufacturer and your health care provider. Contact a health care provider if:  You have ear pain.  You develop a fever.  You have blood, pus, or other fluid coming from your ear.  You have hearing loss.  You have ringing in your ears that does not go away.  Your symptoms do not improve with treatment.  You feel like the room is spinning (vertigo). Summary  Earwax can build up in the ear and cause discomfort or hearing loss.  The most common symptoms of this condition include reduced or muffled hearing and a feeling of fullness in the ear or feeling that the ear is plugged.  This condition may be diagnosed based on your symptoms, your medical history, and an ear exam.  This condition may be treated by using ear drops to soften the earwax or by having the earwax removed by a health care provider.  Do not put any   objects, including cotton swabs, into your ear. You can clean the opening of your ear canal with a washcloth or facial tissue. This information is not intended to replace advice given to you by your health care provider. Make sure you discuss any questions you have with your health care provider. Document Revised: 07/03/2017 Document Reviewed: 10/01/2016 Elsevier Patient Education  2020 [employer]vier Inc.  

## 2019-08-17 NOTE — Progress Notes (Signed)
Subjective:    Patient ID: Anthony [name], male    DOB: 12-09-1955, 64 y.o.   MRN: [id number]  63y/o african [place] male established patient here for ear pain/discharge/muffled hearing right ear.  I think it is blocked with wax again.  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  Patient denied qtip use in ears.  Tried debrox but not helping with pain/discharge.     Review of Systems  Constitutional: Negative for activity change, appetite change, chills, diaphoresis, fatigue, fever and unexpected weight change.  HENT: Positive for ear discharge, ear pain and hearing loss. Negative for facial swelling, mouth sores, nosebleeds, postnasal drip, rhinorrhea, sinus pressure, sinus pain, sneezing, sore throat, tinnitus, trouble swallowing and voice change.   Eyes: Negative for photophobia, pain, discharge, redness, itching and visual disturbance.  Respiratory: Negative for cough.   Cardiovascular: Negative for chest pain.  Gastrointestinal: Negative for diarrhea, nausea and vomiting.  Endocrine: Negative for cold intolerance and heat intolerance.  Genitourinary: Negative for difficulty urinating.  Musculoskeletal: Negative for gait problem, neck pain and neck stiffness.  Skin: Negative for rash and wound.  Allergic/Immunologic: Negative for environmental allergies, food allergies and immunocompromised state.  Neurological: Negative for dizziness, tremors, seizures, syncope, facial asymmetry, speech difficulty, weakness, light-headedness, numbness and headaches.  Hematological: Negative for adenopathy. Does not bruise/bleed easily.  Psychiatric/Behavioral: Negative for agitation, confusion and sleep disturbance.       Objective:   Physical Exam Vitals and nursing note reviewed.  Constitutional:      General: He is awake. He is not in acute distress.    Appearance: Normal appearance. He is well-developed and well-groomed. He is obese. He  is not ill-appearing, toxic-appearing or diaphoretic.  HENT:     Head: Normocephalic and atraumatic.     Jaw: There is normal jaw occlusion. No trismus.     Salivary Glands: Right salivary gland is not diffusely enlarged or tender. Left salivary gland is not diffusely enlarged or tender.     Right Ear: Hearing, ear canal and external ear normal. A middle ear effusion is present. There is impacted cerumen.     Left Ear: Hearing, ear canal and external ear normal. A middle ear effusion is present. There is no impacted cerumen.     Ears:     Comments: Noted cerumen occluding 80% auditory canal except distal 20%; soft gold wax distally removed with currettage ear then ear lavage with waterpik by provider removed remaining cerumen dark brown hard.  Patient will mild pain during lavage resolved when procedure ended TM intact Right air fluid level slight opacity no abrasion or bleeding noted auditory canal after procedure and all debris cleared; left TM air fluid level clear no debris or cerumen in canal noted    Nose: Nose normal. No nasal deformity, septal deviation, laceration, mucosal edema, congestion or rhinorrhea.     Right Sinus: No maxillary sinus tenderness or frontal sinus tenderness.     Left Sinus: No maxillary sinus tenderness or frontal sinus tenderness.     Mouth/Throat:     Lips: Pink. No lesions.     Mouth: Mucous membranes are moist. Mucous membranes are not pale, not dry and not cyanotic. No lacerations, oral lesions or angioedema.     Dentition: No dental caries, dental abscesses or gum lesions.     Tongue: No lesions.     Pharynx: Oropharynx is clear. Uvula midline. No oropharyngeal exudate, posterior  oropharyngeal erythema or uvula swelling.     Tonsils: No tonsillar abscesses.  Eyes:     General: Lids are normal. Vision grossly intact. Gaze aligned appropriately. No allergic shiner, visual field deficit or scleral icterus.       Right eye: No foreign body, discharge or  hordeolum.        Left eye: No foreign body, discharge or hordeolum.     Extraocular Movements: Extraocular movements intact.     Right eye: Normal extraocular motion and no nystagmus.     Left eye: Normal extraocular motion and no nystagmus.     Conjunctiva/sclera: Conjunctivae normal.     Right eye: Right conjunctiva is not injected. No chemosis, exudate or hemorrhage.    Left eye: Left conjunctiva is not injected. No chemosis, exudate or hemorrhage.    Pupils: Pupils are equal, round, and reactive to light. Pupils are equal.     Right eye: Pupil is round and reactive.     Left eye: Pupil is round and reactive.  Neck:     Trachea: Trachea and phonation normal. No tracheal tenderness or tracheal deviation.  Cardiovascular:     Rate and Rhythm: Normal rate and regular rhythm.     Chest Wall: PMI is not displaced.     Pulses:          Radial pulses are 2+ on the right side and 2+ on the left side.     Heart sounds: Normal heart sounds, S1 normal and S2 normal. No murmur. No friction rub. No gallop.   Pulmonary:     Effort: Pulmonary effort is normal. No respiratory distress.     Breath sounds: Normal breath sounds and air entry. No stridor, decreased air movement or transmitted upper airway sounds. No decreased breath sounds, wheezing, rhonchi or rales.     Comments: Wearing cloth mask due to covid 19 pandemic; no cough observed in exam room; spoke full sentences without difficulty Abdominal:     General: There is no distension.     Palpations: Abdomen is soft.  Musculoskeletal:        General: No tenderness. Normal range of motion.     Right shoulder: Normal.     Left shoulder: Normal.     Right elbow: Normal.     Left elbow: Normal.     Right hand: Normal.     Left hand: Normal.     Cervical back: Normal, normal range of motion and neck supple. No edema, erythema, signs of trauma, rigidity, torticollis or crepitus. No pain with movement. Normal range of motion.     Thoracic back:  Normal.     Lumbar back: Normal.     Right hip: Normal.     Left hip: Normal.     Right knee: Normal.     Left knee: Normal.  Lymphadenopathy:     Head:     Right side of head: No submental, submandibular, tonsillar, preauricular, posterior auricular or occipital adenopathy.     Left side of head: No submental, submandibular, tonsillar, preauricular, posterior auricular or occipital adenopathy.     Cervical: No cervical adenopathy.     Right cervical: No superficial, deep or posterior cervical adenopathy.    Left cervical: No superficial, deep or posterior cervical adenopathy.  Skin:    General: Skin is warm and dry.     Capillary Refill: Capillary refill takes less than 2 seconds.     Coloration: Skin is not ashen, cyanotic, jaundiced, mottled, pale or  sallow.     Findings: No abrasion, abscess, acne, bruising, burn, ecchymosis, erythema, signs of injury, laceration, lesion, petechiae, rash or wound.     Nails: There is no clubbing.  Neurological:     General: No focal deficit present.     Mental Status: He is alert and oriented to person, place, and time. Mental status is at baseline.     GCS: GCS eye subscore is 4. GCS verbal subscore is 5. GCS motor subscore is 6.     Cranial Nerves: Cranial nerves are intact. No cranial nerve deficit, dysarthria or facial asymmetry.     Sensory: Sensation is intact. No sensory deficit.     Motor: Motor function is intact. No weakness, tremor, atrophy, abnormal muscle tone or seizure activity.     Coordination: Coordination is intact. Coordination normal.     Gait: Gait is intact. Gait normal.     Comments: Gait sure and steady in hallway; on/off exam table and in/out of chair without difficulty; bilateral hand grasp equal 5/5  Psychiatric:        Attention and Perception: Attention and perception normal.        Mood and Affect: Mood and affect normal.        Speech: Speech normal.        Behavior: Behavior normal. Behavior is cooperative.         Thought Content: Thought content normal.        Cognition and Memory: Cognition and memory normal.        Judgment: Judgment normal.           Assessment & Plan:  A-cerumen impaction right  P-Patient reported slight discomfort external ear canal after procedure curretage and lavage water extraction right ear.  No redness noted bilaterally and TMs intact without erythema.  Patient reported sounds are louder now.  Try 5-10gtts mineral oil external auditory canal daily and may put cotton ball at external ear for 20 minutes after drops instilled.  When in clinic have provider check ears to see if buildup with this method again.  Discussed purpose of earwax with patient.  Avoid cotton applicator (Q-tip) use in ears.  Use pinky finger and towel or washcloth to dry ears after showering.  exitcare handout on cerumen impaction printed and given to patient  Patient verbalized understanding, agreed with plan of care and had no further questions at this time.    Discussed health dept and [facility] will have covid vaccinations available later this month anticipated for ages 38 and up.  Currently vaccinating age 88 and up and I recommend that he get the covid vaccination.  Continue handwashing, wearing mask and social distancing due to local community with 15% covid infection rate at this time.  Patient verbalized understanding information/instructions, agreed with plan of care and had no further questions at this time.

## 2019-10-26 DIAGNOSIS — H2513 Age-related nuclear cataract, bilateral: Secondary | ICD-10-CM | POA: Diagnosis not present

## 2020-03-14 DIAGNOSIS — R7303 Prediabetes: Secondary | ICD-10-CM | POA: Insufficient documentation

## 2020-03-14 DIAGNOSIS — Z0000 Encounter for general adult medical examination without abnormal findings: Secondary | ICD-10-CM | POA: Insufficient documentation

## 2020-03-14 DIAGNOSIS — N138 Other obstructive and reflux uropathy: Secondary | ICD-10-CM | POA: Insufficient documentation

## 2021-10-02 DIAGNOSIS — E7800 Pure hypercholesterolemia, unspecified: Secondary | ICD-10-CM | POA: Diagnosis not present

## 2021-10-02 DIAGNOSIS — I471 Supraventricular tachycardia: Secondary | ICD-10-CM | POA: Diagnosis not present

## 2021-10-02 DIAGNOSIS — Z125 Encounter for screening for malignant neoplasm of prostate: Secondary | ICD-10-CM | POA: Diagnosis not present

## 2021-10-02 DIAGNOSIS — Z0000 Encounter for general adult medical examination without abnormal findings: Secondary | ICD-10-CM | POA: Diagnosis not present

## 2021-10-02 DIAGNOSIS — R7303 Prediabetes: Secondary | ICD-10-CM | POA: Diagnosis not present

## 2021-10-02 DIAGNOSIS — I10 Essential (primary) hypertension: Secondary | ICD-10-CM | POA: Diagnosis not present

## 2021-10-07 DIAGNOSIS — R3915 Urgency of urination: Secondary | ICD-10-CM | POA: Diagnosis not present

## 2021-10-07 DIAGNOSIS — R351 Nocturia: Secondary | ICD-10-CM | POA: Diagnosis not present

## 2021-10-07 DIAGNOSIS — N401 Enlarged prostate with lower urinary tract symptoms: Secondary | ICD-10-CM | POA: Diagnosis not present

## 2021-10-07 DIAGNOSIS — N5201 Erectile dysfunction due to arterial insufficiency: Secondary | ICD-10-CM | POA: Diagnosis not present

## 2022-03-04 DIAGNOSIS — H524 Presbyopia: Secondary | ICD-10-CM | POA: Diagnosis not present

## 2022-03-04 DIAGNOSIS — H5203 Hypermetropia, bilateral: Secondary | ICD-10-CM | POA: Diagnosis not present

## 2022-04-04 DIAGNOSIS — E7800 Pure hypercholesterolemia, unspecified: Secondary | ICD-10-CM | POA: Diagnosis not present

## 2022-04-04 DIAGNOSIS — R7303 Prediabetes: Secondary | ICD-10-CM | POA: Diagnosis not present

## 2022-04-04 DIAGNOSIS — I10 Essential (primary) hypertension: Secondary | ICD-10-CM | POA: Diagnosis not present

## 2022-04-04 DIAGNOSIS — R002 Palpitations: Secondary | ICD-10-CM | POA: Diagnosis not present

## 2022-04-14 DIAGNOSIS — E7800 Pure hypercholesterolemia, unspecified: Secondary | ICD-10-CM | POA: Diagnosis not present

## 2022-04-14 DIAGNOSIS — I10 Essential (primary) hypertension: Secondary | ICD-10-CM | POA: Diagnosis not present

## 2022-04-14 DIAGNOSIS — R002 Palpitations: Secondary | ICD-10-CM | POA: Diagnosis not present

## 2022-04-14 DIAGNOSIS — R7303 Prediabetes: Secondary | ICD-10-CM | POA: Diagnosis not present

## 2022-06-03 DIAGNOSIS — N5201 Erectile dysfunction due to arterial insufficiency: Secondary | ICD-10-CM | POA: Diagnosis not present

## 2022-08-22 DIAGNOSIS — H6121 Impacted cerumen, right ear: Secondary | ICD-10-CM | POA: Diagnosis not present

## 2022-08-22 DIAGNOSIS — H9121 Sudden idiopathic hearing loss, right ear: Secondary | ICD-10-CM | POA: Diagnosis not present

## 2022-08-22 DIAGNOSIS — H66001 Acute suppurative otitis media without spontaneous rupture of ear drum, right ear: Secondary | ICD-10-CM | POA: Diagnosis not present

## 2022-09-10 ENCOUNTER — Other Ambulatory Visit
Admission: RE | Admit: 2022-09-10 | Discharge: 2022-09-10 | Disposition: A | Discharge status: Home / Self Care | Payer: Medicare HMO | Source: Ambulatory Visit | Attending: Internal Medicine | Admitting: Internal Medicine

## 2022-09-10 DIAGNOSIS — H9191 Unspecified hearing loss, right ear: Secondary | ICD-10-CM | POA: Diagnosis not present

## 2022-09-10 DIAGNOSIS — R0789 Other chest pain: Secondary | ICD-10-CM | POA: Insufficient documentation

## 2022-09-10 LAB — D-DIMER, QUANTITATIVE: D-Dimer, Quant: 0.4 ug/mL-FEU (ref 0.00–0.50)

## 2022-09-10 LAB — TROPONIN I (HIGH SENSITIVITY): Troponin I (High Sensitivity): 56 ng/L — ABNORMAL HIGH

## 2022-09-11 ENCOUNTER — Emergency Department
Admission: EM | Admit: 2022-09-11 | Discharge: 2022-09-11 | Disposition: A | Discharge status: Home / Self Care | Payer: Medicare HMO | Attending: Emergency Medicine | Admitting: Emergency Medicine

## 2022-09-11 ENCOUNTER — Emergency Department: Payer: Medicare HMO

## 2022-09-11 ENCOUNTER — Other Ambulatory Visit: Payer: Self-pay

## 2022-09-11 DIAGNOSIS — I10 Essential (primary) hypertension: Secondary | ICD-10-CM | POA: Diagnosis not present

## 2022-09-11 DIAGNOSIS — I159 Secondary hypertension, unspecified: Secondary | ICD-10-CM

## 2022-09-11 DIAGNOSIS — R0789 Other chest pain: Secondary | ICD-10-CM | POA: Diagnosis present

## 2022-09-11 DIAGNOSIS — R778 Other specified abnormalities of plasma proteins: Secondary | ICD-10-CM | POA: Diagnosis not present

## 2022-09-11 DIAGNOSIS — R7989 Other specified abnormal findings of blood chemistry: Secondary | ICD-10-CM | POA: Insufficient documentation

## 2022-09-11 DIAGNOSIS — R079 Chest pain, unspecified: Secondary | ICD-10-CM | POA: Diagnosis not present

## 2022-09-11 LAB — CBC
HCT: 42.9 % (ref 39.0–52.0)
Hemoglobin: 14.2 g/dL (ref 13.0–17.0)
MCH: 29 pg (ref 26.0–34.0)
MCHC: 33.1 g/dL (ref 30.0–36.0)
MCV: 87.6 fL (ref 80.0–100.0)
Platelets: 252 10*3/uL (ref 150–400)
RBC: 4.9 MIL/uL (ref 4.22–5.81)
RDW: 11.9 % (ref 11.5–15.5)
WBC: 4.4 10*3/uL (ref 4.0–10.5)
nRBC: 0 % (ref 0.0–0.2)

## 2022-09-11 LAB — BASIC METABOLIC PANEL
Anion gap: 7 (ref 5–15)
BUN: 17 mg/dL (ref 8–23)
CO2: 24 mmol/L (ref 22–32)
Calcium: 8.8 mg/dL — ABNORMAL LOW (ref 8.9–10.3)
Chloride: 106 mmol/L (ref 98–111)
Creatinine, Ser: 0.98 mg/dL (ref 0.61–1.24)
GFR, Estimated: >60 mL/min (ref >60)
Glucose, Bld: 127 mg/dL — ABNORMAL HIGH (ref 70–99)
Potassium: 3.8 mmol/L (ref 3.5–5.1)
Sodium: 137 mmol/L (ref 135–145)

## 2022-09-11 LAB — TROPONIN I (HIGH SENSITIVITY)
Troponin I (High Sensitivity): 59 ng/L — ABNORMAL HIGH (ref <18)
Troponin I (High Sensitivity): 59 ng/L — ABNORMAL HIGH (ref <18)

## 2022-09-11 MED ORDER — HYDRALAZINE HCL 20 MG/ML IJ SOLN
10.0000 mg | Freq: Once | INTRAMUSCULAR | Status: AC
  Administered 2022-09-11: 10 mg via INTRAVENOUS
  Filled 2022-09-11: qty 1

## 2022-09-11 NOTE — Discharge Instructions (Signed)
Please seek medical attention for any high fevers, chest pain, shortness of breath, change in behavior, persistent vomiting, bloody stool or any other new or concerning symptoms.  

## 2022-09-11 NOTE — ED Provider Notes (Signed)
[facility] Provider Note    Event Date/Time   First MD Initiated Contact with Patient 09/11/22 1105     (approximate)   History   Elevated troponin   HPI  Anthony [name] is a 67 y.o. male   who presents to the emergency department today because of elevated troponin that was found on blood work performed formed by PCP yesterday.  The patient states that he was at his PCPs office to follow-up on ear issues.  He had been seen previously for some right ear hearing loss.  Was put on antibiotics and had not gotten any better.  When he was at his PCPs he also stated he just had not felt well recently.  He states he had felt a little sick on his stomach.  He denies any chest pain or shortness of breath to myself.  PCP ordered blood work.  Additionally found his blood pressure to be elevated.  Started him on blood pressure medications.  When troponin came back elevated called and today to go to the emergency department.      Physical Exam   Triage Vital Signs: ED Triage Vitals  Enc Vitals Group     BP 09/11/22 0900 (!) 176/115     Pulse Rate 09/11/22 0900 86     Resp 09/11/22 0900 20     Temp 09/11/22 0900 97.7 F (36.5 C)     Temp src --      SpO2 09/11/22 0900 100 %     Weight 09/11/22 0906 228 lb (103.4 kg)     Height 09/11/22 0906 5\' 9"  (1.753 m)     Head Circumference --      Peak Flow --      Pain Score 09/11/22 0905 0     Pain Loc --      Pain Edu? --      Excl. in [place]? --     Most recent vital signs: Vitals:   09/11/22 1100 09/11/22 1105  BP: (!) 205/120   Pulse: 87 85  Resp: 18 (!) 21  Temp:    SpO2: 99% 100%    General: Awake, alert, oriented. CV:  Good peripheral perfusion. Regular rate and rhythm. Resp:  Normal effort. Lungs clear. Abd:  No distention.  Other:  No lower extremity edema.   ED Results / Procedures / Treatments   Labs (all labs ordered are listed, but only abnormal results are displayed) Labs Reviewed  BASIC  METABOLIC PANEL - Abnormal; Notable for the following components:      Result Value   Glucose, Bld 127 (*)    Calcium 8.8 (*)    All other components within normal limits  TROPONIN I (HIGH SENSITIVITY) - Abnormal; Notable for the following components:   Troponin I (High Sensitivity) 59 (*)    All other components within normal limits  TROPONIN I (HIGH SENSITIVITY) - Abnormal; Notable for the following components:   Troponin I (High Sensitivity) 59 (*)    All other components within normal limits  CBC     EKG  I, Nance Pear, attending physician, personally viewed and interpreted this EKG  EKG Time: 0900 Rate: 88 Rhythm: normal sinus rhythm Axis: left axis deviation Intervals: qtc 500 QRS: RBBB,LAFB ST changes: no st elevation Impression: abnormal ekg   RADIOLOGY I independently interpreted and visualized the CXR. My interpretation: No pneumonia Radiology interpretation:  IMPRESSION:  Asymmetrically increased right medial apical opacity and nodular  opacity seen on lateral  view most likely represent superimposition  of structures, however pulmonary nodules are not excluded. Consider  chest CT for further evaluation.     PROCEDURES:  Critical Care performed: No  Procedures   MEDICATIONS ORDERED IN ED: Medications - No data to display   IMPRESSION / MDM / [place] / ED COURSE  I reviewed the triage vital signs and the nursing notes.                              Differential diagnosis includes, but is not limited to, acs, pneumonia, baseline elvation.  Patient's presentation is most consistent with acute presentation with potential threat to life or bodily function.  Patient presented to the emergency department today because of concerns for elevated troponins found on outpatient blood work.  Patient denies any chest pain to myself.  Patient's troponin was elevated here.  However no significant change from blood work performed yesterday.  I did  repeat it once here in the emergency department and remained stable.  At this time I have very low concern for ACS.  Do wonder if patient has a slight baseline elevation of his troponin. Additionally patient was given medication to help with his blood pressure. He was just put on blood pressure med by PCP. Furthermore CXR was concerning for possible pulmonary nodule. Discussed this with the patient and will give pulmonary clinic follow up information.   FINAL CLINICAL IMPRESSION(S) / ED DIAGNOSES   Final diagnoses:  Elevated troponin  Secondary hypertension       Note:  This document was prepared using Dragon voice recognition software and may include unintentional dictation errors.    Nance Pear, MD 09/11/22 [phone]

## 2022-09-11 NOTE — ED Triage Notes (Signed)
Pt in from home due to high trop levels yesterday at PCP; denies "CP" but then states "chest discomfort"; states is "feeling crappy"; hasn't taken BP meds yet this morning; denies SOB. Denies nausea or diaphoresis.

## 2022-09-17 ENCOUNTER — Other Ambulatory Visit
Admission: RE | Admit: 2022-09-17 | Discharge: 2022-09-17 | Disposition: A | Discharge status: Home / Self Care | Payer: Medicare HMO | Source: Ambulatory Visit | Attending: Internal Medicine | Admitting: Internal Medicine

## 2022-09-17 DIAGNOSIS — R0789 Other chest pain: Secondary | ICD-10-CM | POA: Diagnosis not present

## 2022-09-17 LAB — TROPONIN I (HIGH SENSITIVITY): Troponin I (High Sensitivity): 58 ng/L — ABNORMAL HIGH (ref <18)

## 2022-09-18 DIAGNOSIS — I471 Supraventricular tachycardia, unspecified: Secondary | ICD-10-CM | POA: Diagnosis not present

## 2022-09-18 DIAGNOSIS — R0789 Other chest pain: Secondary | ICD-10-CM | POA: Diagnosis not present

## 2022-09-18 DIAGNOSIS — Z6832 Body mass index (BMI) 32.0-32.9, adult: Secondary | ICD-10-CM | POA: Diagnosis not present

## 2022-09-18 DIAGNOSIS — E669 Obesity, unspecified: Secondary | ICD-10-CM | POA: Diagnosis not present

## 2022-09-18 DIAGNOSIS — E7800 Pure hypercholesterolemia, unspecified: Secondary | ICD-10-CM | POA: Diagnosis not present

## 2022-09-18 DIAGNOSIS — R7989 Other specified abnormal findings of blood chemistry: Secondary | ICD-10-CM | POA: Diagnosis not present

## 2022-09-18 DIAGNOSIS — I10 Essential (primary) hypertension: Secondary | ICD-10-CM | POA: Diagnosis not present

## 2022-09-18 DIAGNOSIS — R7303 Prediabetes: Secondary | ICD-10-CM | POA: Diagnosis not present

## 2022-09-29 DIAGNOSIS — H9041 Sensorineural hearing loss, unilateral, right ear, with unrestricted hearing on the contralateral side: Secondary | ICD-10-CM | POA: Diagnosis not present

## 2022-09-29 DIAGNOSIS — H6121 Impacted cerumen, right ear: Secondary | ICD-10-CM | POA: Diagnosis not present

## 2022-10-01 ENCOUNTER — Encounter: Payer: Self-pay | Admitting: Student in an Organized Health Care Education/Training Program

## 2022-10-01 ENCOUNTER — Ambulatory Visit: Payer: Medicare HMO | Admitting: Student in an Organized Health Care Education/Training Program

## 2022-10-01 VITALS — BP 134/80 | HR 67 | Temp 97.7°F | Ht 70.0 in | Wt 229.0 lb

## 2022-10-01 DIAGNOSIS — R0789 Other chest pain: Secondary | ICD-10-CM | POA: Diagnosis not present

## 2022-10-01 DIAGNOSIS — R911 Solitary pulmonary nodule: Secondary | ICD-10-CM

## 2022-10-01 NOTE — Progress Notes (Signed)
Synopsis: Referred in for abnormal chest xray by Nance Pear, MD  Assessment & Plan:   1. Pulmonary Opacity on CXR  Presenting for the evaluation of a pulmonary opacity noted on chest xray, concerning for possible pulmonary nodule. Patient is asymptomatic and is a non-smoker. His main risk is from his work [employer]. I've reviewed his previous imaging and there was no finding in the area of concern from a prior CT in 2014. Lung parenchyma also appears normal on cardiac CT from July of 2020. Will obtain a chest CT to rule out any pulmonary mass.  - CT CHEST WO CONTRAST; Future   Return if symptoms worsen or fail to improve.  I spent 45 minutes caring for this patient today, including preparing to see the patient, obtaining a medical history , reviewing a separately obtained history, performing a medically appropriate examination and/or evaluation, counseling and educating the patient/family/caregiver, ordering medications, tests, or procedures, documenting clinical information in the electronic health record, and independently interpreting results (not separately reported/billed) and communicating results to the patient/family/caregiver  Armando Reichert, MD [facility] 10/01/2022 8:50 AM    End of visit medications:  No orders of the defined types were placed in this encounter.    Current Outpatient Medications:    carvedilol (COREG) 6.25 MG tablet, Take 6.25 mg by mouth., Disp: , Rfl:    rosuvastatin (CRESTOR) 10 MG tablet, Take 10 mg by mouth daily., Disp: , Rfl:    atorvastatin (LIPITOR) 20 MG tablet, TAKE 1 TABLET (20 MG TOTAL) BY MOUTH DAILY. (Patient not taking: Reported on 12/03/2018), Disp: 90 tablet, Rfl: 3   diltiazem (CARDIZEM CD) 240 MG 24 hr capsule, TAKE 1 CAPSULE (240 MG TOTAL) BY MOUTH DAILY. (Patient not taking: Reported on 12/03/2018), Disp: 90 capsule, Rfl: 2   sildenafil (REVATIO) 20 MG tablet, 1-3 tabs po prn x1 before sex (Patient not  taking: Reported on 08/17/2019), Disp: 50 tablet, Rfl: 2   Testosterone (ANDROGEL) 40.5 MG/2.5GM (1.62%) GEL, Place 2 application onto the skin daily. (Patient not taking: Reported on 10/01/2022), Disp: , Rfl:    Subjective:   PATIENT ID: Anthony [name] GENDER: male DOB: Dec 09, 1955, MRN: [id number]  Chief Complaint  Patient presents with   pulmonary consult    CXR 09/11/2022-no current sx.     HPI  Patient is a pleasant 67 year old male presenting to clinic for the evaluation of an abnormal chest xray.  He reporting going to the ED because his PCP asked him to after he was noted to have an elevated troponin. He has since been seen by cardiology and will undergo an exercise stress test. He has no pulmonary symptoms and is overall asymptomatic. No shortness of breath, no cough, no sputum production, no night sweats, no weight loss, no edema, and no fevers or chills. He is in his usual state of health.  He is a never smoker, and used to work for the [employer]. No exposure to asbestos reported. Patient grew up in [place], and currently lives in [place], [place].   Ancillary information including prior medications, full medical/surgical/family/social histories, and PFTs (when available) are listed below and have been reviewed.   Review of Systems  Constitutional:  Negative for chills, diaphoresis, fever, malaise/fatigue and weight loss.  Respiratory:  Negative for cough, hemoptysis, sputum production, shortness of breath and wheezing.   Cardiovascular:  Negative for chest pain, palpitations, orthopnea, claudication, leg swelling and PND.  Gastrointestinal:  Negative for abdominal pain,  nausea and vomiting.  Musculoskeletal:  Negative for myalgias.  Skin:  Negative for rash.  Neurological:  Negative for dizziness.     Objective:   Vitals:   10/01/22 0834  BP: 134/80  Pulse: 67  Temp: 97.7 F (36.5 C)  TempSrc: Temporal  SpO2: 97%  Weight: 229 lb (103.9 kg)   Height: '5\' 10"'$  (1.778 m)   97% on RA  BMI Readings from Last 3 Encounters:  10/01/22 32.86 kg/m  09/11/22 33.67 kg/m  08/17/19 33.55 kg/m   Wt Readings from Last 3 Encounters:  10/01/22 229 lb (103.9 kg)  09/11/22 228 lb (103.4 kg)  08/17/19 233 lb 12.8 oz (106.1 kg)    Physical Exam Constitutional:      General: He is not in acute distress.    Appearance: Normal appearance. He is obese. He is not ill-appearing or toxic-appearing.  HENT:     Head: Normocephalic.     Nose: Nose normal.     Mouth/Throat:     Mouth: Mucous membranes are moist.  Cardiovascular:     Rate and Rhythm: Normal rate and regular rhythm.     Pulses: Normal pulses.     Heart sounds: Normal heart sounds.  Pulmonary:     Effort: Pulmonary effort is normal.     Breath sounds: Normal breath sounds.  Abdominal:     Palpations: Abdomen is soft.  Musculoskeletal:     Cervical back: Normal range of motion.     Right lower leg: No edema.     Left lower leg: No edema.  Lymphadenopathy:     Cervical: No cervical adenopathy.  Skin:    General: Skin is warm.  Neurological:     General: No focal deficit present.     Mental Status: He is alert and oriented to person, place, and time. Mental status is at baseline.     Ancillary Information    Past Medical History:  Diagnosis Date   Dysrhythmia    Hyperlipidemia    Hypertension    Hypokalemia    Palpitations    SVT (supraventricular tachycardia)      Family History  Problem Relation Age of Onset   Heart attack Mother 80   Hypertension Father    Hyperlipidemia Father    Hypertension Sister      Past Surgical History:  Procedure Laterality Date   COLONOSCOPY WITH PROPOFOL N/A 01/15/2016   Procedure: COLONOSCOPY WITH PROPOFOL;  Surgeon: Lollie Sails, MD;  Location: [facility] ENDOSCOPY;  Service: Endoscopy;  Laterality: N/A;   TONSILLECTOMY      Social History   Socioeconomic History   Marital status: Married    Spouse name: Not on  file   Number of children: Not on file   Years of education: Not on file   Highest education level: Not on file  Occupational History   Not on file  Tobacco Use   Smoking status: Never   Smokeless tobacco: Never  Substance and Sexual Activity   Alcohol use: No   Drug use: No   Sexual activity: Not on file  Other Topics Concern   Not on file  Social History Narrative   Not on file   Social Determinants of Health   Financial Resource Strain: Not on file  Food Insecurity: Not on file  Transportation Needs: Not on file  Physical Activity: Not on file  Stress: Not on file  Social Connections: Not on file  Intimate Partner Violence: Not on file  Allergies  Allergen Reactions   No Known Allergies      CBC    Component Value Date/Time   WBC 4.4 09/11/2022 0907   RBC 4.90 09/11/2022 0907   HGB 14.2 09/11/2022 0907   HGB 16.6 04/29/2013 2235   HCT 42.9 09/11/2022 0907   HCT 48.4 04/29/2013 2235   PLT 252 09/11/2022 0907   PLT 269 04/29/2013 2235   MCV 87.6 09/11/2022 0907   MCV 88 04/29/2013 2235   MCH 29.0 09/11/2022 0907   MCHC 33.1 09/11/2022 0907   RDW 11.9 09/11/2022 0907   RDW 13.2 04/29/2013 2235    Pulmonary Functions Testing Results:     No data to display          Outpatient Medications Prior to Visit  Medication Sig Dispense Refill   carvedilol (COREG) 6.25 MG tablet Take 6.25 mg by mouth.     rosuvastatin (CRESTOR) 10 MG tablet Take 10 mg by mouth daily.     atorvastatin (LIPITOR) 20 MG tablet TAKE 1 TABLET (20 MG TOTAL) BY MOUTH DAILY. (Patient not taking: Reported on 12/03/2018) 90 tablet 3   diltiazem (CARDIZEM CD) 240 MG 24 hr capsule TAKE 1 CAPSULE (240 MG TOTAL) BY MOUTH DAILY. (Patient not taking: Reported on 12/03/2018) 90 capsule 2   sildenafil (REVATIO) 20 MG tablet 1-3 tabs po prn x1 before sex (Patient not taking: Reported on 08/17/2019) 50 tablet 2   Testosterone (ANDROGEL) 40.5 MG/2.5GM (1.62%) GEL Place 2 application onto the skin  daily. (Patient not taking: Reported on 10/01/2022)     rosuvastatin (CRESTOR) 10 MG tablet Take 1 tablet (10 mg total) by mouth daily. (Patient not taking: Reported on 08/17/2019) 90 tablet 3   No facility-administered medications prior to visit.

## 2022-10-03 ENCOUNTER — Ambulatory Visit: Payer: Medicare HMO | Admitting: Urology

## 2022-10-03 ENCOUNTER — Encounter: Payer: Self-pay | Admitting: Urology

## 2022-10-03 ENCOUNTER — Other Ambulatory Visit: Payer: Self-pay | Admitting: Urology

## 2022-10-03 VITALS — BP 181/93 | HR 76 | Ht 70.0 in | Wt 228.0 lb

## 2022-10-03 DIAGNOSIS — N401 Enlarged prostate with lower urinary tract symptoms: Secondary | ICD-10-CM | POA: Diagnosis not present

## 2022-10-03 DIAGNOSIS — R350 Frequency of micturition: Secondary | ICD-10-CM | POA: Diagnosis not present

## 2022-10-03 DIAGNOSIS — N529 Male erectile dysfunction, unspecified: Secondary | ICD-10-CM | POA: Diagnosis not present

## 2022-10-03 MED ORDER — TADALAFIL 5 MG PO TABS: 5.0000 mg | ORAL_TABLET | Freq: Every day | ORAL | 3 refills | Status: DC

## 2022-10-03 MED ORDER — SILDENAFIL CITRATE 20 MG PO TABS: ORAL_TABLET | ORAL | 2 refills | Status: DC

## 2022-10-03 NOTE — Progress Notes (Signed)
10/03/2022 1:17 PM   Anthony [name] 02/23/1956 [id number]  Referring provider: No referring provider defined for this encounter.  Chief Complaint  Patient presents with   New Patient (Initial Visit)    HPI: Anthony [name] is a 67 y.o. male who presents for transfer of urologic care after recent retirement of his urologist.  Followed by Dr. Yves [name] for BPH and erectile dysfunction Was taking tadalafil 5 mg daily for BPH and sildenafil 60 mg as needed for ED Was last seen by Dr. Yves [name] 10/07/2021.  His IPSS was 17/35.  He was started on alfuzosin at that visit and intracavernosal injections were discussed PSA with his PCP 10/2021 was 0.99  Since his last visit he states he does not remember taking alfuzosin.  He has run out of tadalafil and does think it was helping his lower urinary tract symptoms.  IPSS today 15/35 Most bothersome symptoms are frequency and urgency   PMH: Past Medical History:  Diagnosis Date   Dysrhythmia    Hyperlipidemia    Hypertension    Hypokalemia    Palpitations    SVT (supraventricular tachycardia)     Surgical History: Past Surgical History:  Procedure Laterality Date   COLONOSCOPY WITH PROPOFOL N/A 01/15/2016   Procedure: COLONOSCOPY WITH PROPOFOL;  Surgeon: Anthony Sails, MD;  Location: [facility] ENDOSCOPY;  Service: Endoscopy;  Laterality: N/A;   TONSILLECTOMY      Home Medications:  Allergies as of 10/03/2022       Reactions   No Known Allergies         Medication List        Accurate as of October 03, 2022  1:17 PM. If you have any questions, ask your nurse or doctor.          STOP taking these medications    AndroGel 40.5 MG/2.5GM (1.62%) Gel Generic drug: Testosterone Stopped by: Anthony Sons, MD   atorvastatin 20 MG tablet Commonly known as: LIPITOR Stopped by: Anthony Sons, MD   diltiazem 240 MG 24 hr capsule Commonly known as: CARDIZEM CD Stopped by: Anthony Sons, MD       TAKE these medications     carvedilol 6.25 MG tablet Commonly known as: COREG Take 6.25 mg by mouth.   rosuvastatin 20 MG tablet Commonly known as: CRESTOR Take 20 mg by mouth daily. What changed: Another medication with the same name was removed. Continue taking this medication, and follow the directions you see here. Changed by: Anthony Sons, MD   sildenafil 100 MG tablet Commonly known as: VIAGRA Take 100 mg by mouth daily as needed for erectile dysfunction.   sildenafil 20 MG tablet Commonly known as: REVATIO 1-3 tabs po prn x1 before sex   tadalafil 5 MG tablet Commonly known as: CIALIS Take 1 tablet (5 mg total) by mouth daily. Started by: Anthony Sons, MD        Allergies:  Allergies  Allergen Reactions   No Known Allergies     Family History: Family History  Problem Relation Age of Onset   Heart attack Mother 62   Hypertension Father    Hyperlipidemia Father    Hypertension Sister     Social History:  reports that he has never smoked. He has never been exposed to tobacco smoke. He has never used smokeless tobacco. He reports that he does not drink alcohol and does not use drugs.   Physical Exam: BP (!) 181/93   Pulse 76  Ht '5\' 10"'$  (1.778 m)   Wt 228 lb (103.4 kg)   BMI 32.71 kg/m   Constitutional:  Alert and oriented, No acute distress. HEENT: [place] AT Respiratory: Normal respiratory effort, no increased work of breathing. GU: Prostate 40 g, smooth without nodules Skin: No rashes, bruises or suspicious lesions. Neurologic: Grossly intact, no focal deficits, moving all 4 extremities. Psychiatric: Normal mood and affect.   Assessment & Plan:    1.  BPH with LUTS He desires to start back on low-dose tadalafil and Rx sent to pharmacy Instructed to call earlier for persistent bothersome lower urinary tract symptoms on this medication  2.  Erectile dysfunction Requested refill sildenafil  3.  Prostate cancer screening He states he is scheduled for a follow-up  appointment this month with his PCP and PSA will be drawn at that time  Continue annual follow-up   Anthony [name], [place] [address] [place], [place] [address] [phone]

## 2022-10-04 ENCOUNTER — Other Ambulatory Visit: Payer: Self-pay | Admitting: Urology

## 2022-10-06 ENCOUNTER — Other Ambulatory Visit: Payer: Self-pay | Admitting: Urology

## 2022-10-08 ENCOUNTER — Ambulatory Visit
Admission: RE | Admit: 2022-10-08 | Discharge: 2022-10-08 | Disposition: A | Discharge status: Home / Self Care | Payer: Medicare HMO | Source: Ambulatory Visit | Attending: Internal Medicine | Admitting: Internal Medicine

## 2022-10-08 DIAGNOSIS — E7800 Pure hypercholesterolemia, unspecified: Secondary | ICD-10-CM | POA: Diagnosis not present

## 2022-10-08 DIAGNOSIS — R911 Solitary pulmonary nodule: Secondary | ICD-10-CM | POA: Insufficient documentation

## 2022-10-08 DIAGNOSIS — I5022 Chronic systolic (congestive) heart failure: Secondary | ICD-10-CM | POA: Diagnosis not present

## 2022-10-08 DIAGNOSIS — R7303 Prediabetes: Secondary | ICD-10-CM | POA: Diagnosis not present

## 2022-10-08 DIAGNOSIS — Z0000 Encounter for general adult medical examination without abnormal findings: Secondary | ICD-10-CM | POA: Diagnosis not present

## 2022-10-08 DIAGNOSIS — Z1331 Encounter for screening for depression: Secondary | ICD-10-CM | POA: Diagnosis not present

## 2022-10-08 DIAGNOSIS — Z125 Encounter for screening for malignant neoplasm of prostate: Secondary | ICD-10-CM | POA: Diagnosis not present

## 2022-10-08 DIAGNOSIS — Z0389 Encounter for observation for other suspected diseases and conditions ruled out: Secondary | ICD-10-CM | POA: Diagnosis not present

## 2022-10-08 DIAGNOSIS — I110 Hypertensive heart disease with heart failure: Secondary | ICD-10-CM | POA: Diagnosis not present

## 2022-10-10 ENCOUNTER — Emergency Department
Admission: EM | Admit: 2022-10-10 | Discharge: 2022-10-10 | Disposition: A | Discharge status: Home / Self Care | Payer: Medicare HMO | Attending: Student in an Organized Health Care Education/Training Program | Admitting: Student in an Organized Health Care Education/Training Program

## 2022-10-10 ENCOUNTER — Emergency Department: Payer: Medicare HMO

## 2022-10-10 DIAGNOSIS — Z20822 Contact with and (suspected) exposure to covid-19: Secondary | ICD-10-CM | POA: Insufficient documentation

## 2022-10-10 DIAGNOSIS — I10 Essential (primary) hypertension: Secondary | ICD-10-CM | POA: Diagnosis not present

## 2022-10-10 DIAGNOSIS — R7303 Prediabetes: Secondary | ICD-10-CM | POA: Insufficient documentation

## 2022-10-10 DIAGNOSIS — Z79899 Other long term (current) drug therapy: Secondary | ICD-10-CM | POA: Insufficient documentation

## 2022-10-10 DIAGNOSIS — Z1152 Encounter for screening for COVID-19: Secondary | ICD-10-CM | POA: Insufficient documentation

## 2022-10-10 DIAGNOSIS — R42 Dizziness and giddiness: Secondary | ICD-10-CM | POA: Diagnosis not present

## 2022-10-10 DIAGNOSIS — R1111 Vomiting without nausea: Secondary | ICD-10-CM | POA: Diagnosis not present

## 2022-10-10 DIAGNOSIS — R55 Syncope and collapse: Secondary | ICD-10-CM | POA: Insufficient documentation

## 2022-10-10 DIAGNOSIS — R7989 Other specified abnormal findings of blood chemistry: Secondary | ICD-10-CM | POA: Insufficient documentation

## 2022-10-10 LAB — CBC WITH DIFFERENTIAL/PLATELET
Abs Immature Granulocytes: 0.01 10*3/uL (ref 0.00–0.07)
Basophils Absolute: 0 10*3/uL (ref 0.0–0.1)
Basophils Relative: 1 %
Eosinophils Absolute: 0.4 10*3/uL (ref 0.0–0.5)
Eosinophils Relative: 7 %
HCT: 42.6 % (ref 39.0–52.0)
Hemoglobin: 14.3 g/dL (ref 13.0–17.0)
Immature Granulocytes: 0 %
Lymphocytes Relative: 42 %
Lymphs Abs: 2.2 10*3/uL (ref 0.7–4.0)
MCH: 29.2 pg (ref 26.0–34.0)
MCHC: 33.6 g/dL (ref 30.0–36.0)
MCV: 86.9 fL (ref 80.0–100.0)
Monocytes Absolute: 0.6 10*3/uL (ref 0.1–1.0)
Monocytes Relative: 11 %
Neutro Abs: 2.1 10*3/uL (ref 1.7–7.7)
Neutrophils Relative %: 39 %
Platelets: 263 10*3/uL (ref 150–400)
RBC: 4.9 MIL/uL (ref 4.22–5.81)
RDW: 11.9 % (ref 11.5–15.5)
WBC: 5.3 10*3/uL (ref 4.0–10.5)
nRBC: 0 % (ref 0.0–0.2)

## 2022-10-10 LAB — CBG MONITORING, ED: Glucose-Capillary: 122 mg/dL — ABNORMAL HIGH (ref 70–99)

## 2022-10-10 LAB — COMPREHENSIVE METABOLIC PANEL
ALT: 22 U/L (ref 0–44)
AST: 26 U/L (ref 15–41)
Albumin: 4.2 g/dL (ref 3.5–5.0)
Alkaline Phosphatase: 73 U/L (ref 38–126)
Anion gap: 9 (ref 5–15)
BUN: 20 mg/dL (ref 8–23)
CO2: 25 mmol/L (ref 22–32)
Calcium: 9.1 mg/dL (ref 8.9–10.3)
Chloride: 101 mmol/L (ref 98–111)
Creatinine, Ser: 1.15 mg/dL (ref 0.61–1.24)
GFR, Estimated: >60 mL/min (ref >60)
Glucose, Bld: 108 mg/dL — ABNORMAL HIGH (ref 70–99)
Potassium: 3.3 mmol/L — ABNORMAL LOW (ref 3.5–5.1)
Sodium: 135 mmol/L (ref 135–145)
Total Bilirubin: 0.7 mg/dL (ref 0.3–1.2)
Total Protein: 7.7 g/dL (ref 6.5–8.1)

## 2022-10-10 LAB — URINALYSIS, ROUTINE W REFLEX MICROSCOPIC
Bacteria, UA: NONE SEEN
Bilirubin Urine: NEGATIVE
Glucose, UA: >=500 mg/dL — AB
Hgb urine dipstick: NEGATIVE
Ketones, ur: NEGATIVE mg/dL
Leukocytes,Ua: NEGATIVE
Nitrite: NEGATIVE
Protein, ur: NEGATIVE mg/dL
Specific Gravity, Urine: 1.017 (ref 1.005–1.030)
Squamous Epithelial / HPF: NONE SEEN /HPF (ref 0–5)
pH: 7 (ref 5.0–8.0)

## 2022-10-10 LAB — PROTIME-INR
INR: 1 (ref 0.8–1.2)
Prothrombin Time: 13.3 seconds (ref 11.4–15.2)

## 2022-10-10 LAB — LIPASE, BLOOD: Lipase: 33 U/L (ref 11–51)

## 2022-10-10 LAB — RESP PANEL BY RT-PCR (RSV, FLU A&B, COVID)  RVPGX2
Influenza A by PCR: NEGATIVE
Influenza B by PCR: NEGATIVE
Resp Syncytial Virus by PCR: NEGATIVE
SARS Coronavirus 2 by RT PCR: NEGATIVE

## 2022-10-10 LAB — TROPONIN I (HIGH SENSITIVITY): Troponin I (High Sensitivity): 45 ng/L — ABNORMAL HIGH (ref <18)

## 2022-10-10 MED ORDER — SODIUM CHLORIDE 0.9 % IV BOLUS
1000.0000 mL | Freq: Once | INTRAVENOUS | Status: AC
  Administered 2022-10-10: 1000 mL via INTRAVENOUS

## 2022-10-10 NOTE — ED Triage Notes (Signed)
Pt presents to the ED with [facility]. 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 States that he stood up and started to walk outside when he vomited and passed out. Pt denies any pain. Was recently seen in the ER [facility] of [facility] and 589 per patient. Pt also had an echo and stress test around that time. Pt A&Ox4 at time of triage. VSS. Pt given '324mg'$  of aspirin with EMS

## 2022-10-10 NOTE — ED Provider Notes (Addendum)
[facility] Provider Note  Patient Contact: 3:29 PM (approximate)   History   Loss of Consciousness   HPI  Anthony [name] is a 67 y.o. male who presents the emergency department via EMS for syncope.  Patient states that over the last month he had a period where he did not feel well, saw primary care, had elevated troponins, came to the emergency department with stable but elevated troponins.  Patient had his follow-up with cardiology, underwent testing with good results.  Patient had been feeling well, saw primary care and was started on 2 new blood pressure medications as well as Jardiance.  First dosing was yesterday.  Patient states that he was out and about, started feeling hot, sweaty.  Felt like he needed to go outside and get some fresh air and when he did he started vomiting and had a syncopal episode.  Syncope lasted a few seconds.  He did not sustain any injuries during the syncopal episode.  He denies any headache, vision changes, unilateral weakness, chest pain, shortness of breath, abdominal pain at this time.  States that he feels back to baseline at this time.  When the episode started he started feeling hot, flushed, felt like he was dizzy.  He had no pain at that time either.  On review of patient's medical record he had a recent echo with 35 to 45% ejection fraction.  Patient has no cardiac wall motion abnormalities seen on echo.  Patient's troponins have been stable.  He has also seen primary care, has had slightly elevated A1c's but no elevated glucose readings.  Patient was placed on Jardiance for prediabetes.     Physical Exam   Triage Vital Signs: ED Triage Vitals  Enc Vitals Group     BP 10/10/22 1515 (!) 158/96     Pulse Rate 10/10/22 1515 75     Resp 10/10/22 1515 18     Temp 10/10/22 1515 97.7 F (36.5 C)     Temp Source 10/10/22 1515 Oral     SpO2 10/10/22 1511 98 %     Weight 10/10/22 1516 225 lb (102.1 kg)     Height 10/10/22  1516 '5\' 10"'$  (1.778 m)     Head Circumference --      Peak Flow --      Pain Score 10/10/22 1516 0     Pain Loc --      Pain Edu? --      Excl. in [place]? --     Most recent vital signs: Vitals:   10/10/22 1511 10/10/22 1515  BP:  (!) 158/96  Pulse:  75  Resp:  18  Temp:  97.7 F (36.5 C)  SpO2: 98% 97%     General: Alert and in no acute distress. Eyes:  PERRL. EOMI. ENT:      Ears:       Nose: No congestion/rhinnorhea.      Mouth/Throat: Mucous membranes are moist. Neck: No stridor. No cervical spine tenderness to palpation.  Cardiovascular:  Good peripheral perfusion Respiratory: Normal respiratory effort without tachypnea or retractions. Lungs CTAB. Good air entry to the bases with no decreased or absent breath sounds Gastrointestinal: Bowel sounds 4 quadrants. Soft and nontender to palpation. No guarding or rigidity. No palpable masses. No distention. No CVA tenderness. Musculoskeletal: Full range of motion to all extremities.  Neurologic:  No gross focal neurologic deficits are appreciated.  Skin:   No rash noted Other:   ED Results /  Procedures / Treatments   Labs (all labs ordered are listed, but only abnormal results are displayed) Labs Reviewed  COMPREHENSIVE METABOLIC PANEL - Abnormal; Notable for the following components:      Result Value   Potassium 3.3 (*)    Glucose, Bld 108 (*)    All other components within normal limits  URINALYSIS, ROUTINE W REFLEX MICROSCOPIC - Abnormal; Notable for the following components:   Color, Urine STRAW (*)    APPearance CLEAR (*)    Glucose, UA >=500 (*)    All other components within normal limits  CBG MONITORING, ED - Abnormal; Notable for the following components:   Glucose-Capillary 122 (*)    All other components within normal limits  TROPONIN I (HIGH SENSITIVITY) - Abnormal; Notable for the following components:   Troponin I (High Sensitivity) 45 (*)    All other components within normal limits  RESP PANEL BY  RT-PCR (RSV, FLU A&B, COVID)  RVPGX2  CBC WITH DIFFERENTIAL/PLATELET  LIPASE, BLOOD  PROTIME-INR  TROPONIN I (HIGH SENSITIVITY)     EKG  ED ECG REPORT I, Charline Bills Taevon Aschoff,  personally viewed and interpreted this ECG.   Date: 10/10/2022  EKG Time: 1600 hrs.  Rate: 67 bpm  Rhythm: unchanged from previous tracings, normal sinus rhythm, RBBB, no significant change from previous EKG from 09/11/2022  Axis: Leftward axis  Intervals:right bundle branch block, QTC prolonged at 505  ST&T Change: No gross ST elevation or depression noted  Normal sinus rhythm.  Right bundle branch block. Ongoing and largely unchanged prolonged QTc  No STEMI.  No significant change from previous EKG from 09/11/2022    RADIOLOGY  I personally viewed, evaluated, and interpreted these images as part of my medical decision making, as well as reviewing the written report by the radiologist.  ED Provider Interpretation: No cardiopulmonary abnormality seen on chest x-ray  DG Chest 2 View  Result Date: 10/10/2022 CLINICAL DATA:  Syncope EXAM: CHEST - 2 VIEW COMPARISON:  09/11/2022 FINDINGS: Heart size and pulmonary vascularity are normal. Lungs are clear. No pleural effusions. No pneumothorax. Mediastinal contours appear intact. Degenerative changes in the spine and shoulders. IMPRESSION: No active cardiopulmonary disease. Electronically Signed   By: Lucienne Capers M.D.   On: 10/10/2022 15:51    PROCEDURES:  Critical Care performed: No  Procedures   MEDICATIONS ORDERED IN ED: Medications  sodium chloride 0.9 % bolus 1,000 mL (1,000 mLs Intravenous New Bag/Given 10/10/22 1607)     IMPRESSION / MDM / ASSESSMENT AND PLAN / ED COURSE  I reviewed the triage vital signs and the nursing notes.                                 Differential diagnosis includes, but is not limited to, vasovagal syncope, hypoglycemia, hypotension, STEMI/ACS, viral illness, norovirus  The patient is on the cardiac monitor to  evaluate for evidence of arrhythmia and/or significant heart rate changes.  Patient's presentation is most consistent with acute presentation with potential threat to life or bodily function.   Patient's diagnosis is consistent with syncope and collapse.  Patient presents emergency department for a syncopal episode today.  Patient states that yesterday he started to new blood pressure medications as well as a medicine for prediabetes.  Patient is unsure what the antihypertensive were but patient states that he started [place] as well.  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  He states that he became diaphoretic.  He went outside to get some fresh air and had a very brief syncopal episode.  At the time when patient exited the building he became nauseated, had emesis.  After this encounter patient has felt like his normal self.  He was evaluated roughly a month ago after having an elevated troponin.  He seen cardiology with reassuring workup.  Today patient's EKG is reassuring and similar to previous EKGs.  Troponin is actually slightly lower than the patient's normal.  Patient's labs there is otherwise reassuring.  He has had no return of symptoms, feels back to his baseline at this time.  Given the 2 new medications for hypertension as well as the medication for Jardiance I suspect that this was medication side effect with vasovagal syncope.  At this time I see no indication for further workup.  No indication for admission.  Patient is given strict return precautions for any new symptoms.  Follow-up with primary care to discuss possibly stopping or changing the medications he was just started on.  I recommended he return to his previous regimen medications.  Patient will follow-up primary care..  Patient is given ED precautions to return to the ED for any worsening or new symptoms.     FINAL CLINICAL IMPRESSION(S) / ED DIAGNOSES   Final diagnoses:  Syncope and collapse      Rx / DC Orders   ED Discharge Orders     None        Note:  This document was prepared using Dragon voice recognition software and may include unintentional dictation errors.   Darletta Moll, PA-C 10/10/22 1757    Darletta Moll, PA-C 10/10/22 1759    Merlyn Lot, MD 10/10/22 [phone]

## 2022-10-14 DIAGNOSIS — I5022 Chronic systolic (congestive) heart failure: Secondary | ICD-10-CM | POA: Diagnosis not present

## 2022-10-14 DIAGNOSIS — I110 Hypertensive heart disease with heart failure: Secondary | ICD-10-CM | POA: Diagnosis not present

## 2022-10-15 ENCOUNTER — Telehealth: Payer: Self-pay

## 2022-10-15 NOTE — Telephone Encounter (Signed)
     Patient  visit on 3/8  at [facility]   Have you been able to follow up with your primary care physician? Yes   The patient was or was not able to obtain any needed medicine or equipment. Yes   Are there diet recommendations that you are having difficulty following? Na   Patient expresses understanding of discharge instructions and education provided has no other needs at this time.  Yes      [facility] [id number] [address], [place], [place] [address] Phone: [phone] Email: [email or web address]

## 2022-11-05 ENCOUNTER — Other Ambulatory Visit: Payer: Self-pay | Admitting: Internal Medicine

## 2022-11-05 DIAGNOSIS — I471 Supraventricular tachycardia, unspecified: Secondary | ICD-10-CM | POA: Diagnosis not present

## 2022-11-05 DIAGNOSIS — I429 Cardiomyopathy, unspecified: Secondary | ICD-10-CM

## 2022-11-05 DIAGNOSIS — R7303 Prediabetes: Secondary | ICD-10-CM | POA: Diagnosis not present

## 2022-11-05 DIAGNOSIS — E7800 Pure hypercholesterolemia, unspecified: Secondary | ICD-10-CM | POA: Diagnosis not present

## 2022-11-05 DIAGNOSIS — I5023 Acute on chronic systolic (congestive) heart failure: Secondary | ICD-10-CM

## 2022-11-05 DIAGNOSIS — I10 Essential (primary) hypertension: Secondary | ICD-10-CM | POA: Diagnosis not present

## 2022-11-05 DIAGNOSIS — E669 Obesity, unspecified: Secondary | ICD-10-CM | POA: Diagnosis not present

## 2022-11-05 DIAGNOSIS — R0789 Other chest pain: Secondary | ICD-10-CM | POA: Diagnosis not present

## 2022-11-07 ENCOUNTER — Ambulatory Visit
Admission: RE | Admit: 2022-11-07 | Discharge: 2022-11-07 | Disposition: A | Discharge status: Home / Self Care | Payer: Medicare HMO | Source: Ambulatory Visit | Attending: Internal Medicine | Admitting: Internal Medicine

## 2022-11-07 DIAGNOSIS — I429 Cardiomyopathy, unspecified: Secondary | ICD-10-CM | POA: Insufficient documentation

## 2022-11-07 DIAGNOSIS — I5023 Acute on chronic systolic (congestive) heart failure: Secondary | ICD-10-CM | POA: Insufficient documentation

## 2022-12-02 DIAGNOSIS — I429 Cardiomyopathy, unspecified: Secondary | ICD-10-CM | POA: Diagnosis not present

## 2022-12-02 DIAGNOSIS — I10 Essential (primary) hypertension: Secondary | ICD-10-CM | POA: Diagnosis not present

## 2022-12-02 DIAGNOSIS — I5022 Chronic systolic (congestive) heart failure: Secondary | ICD-10-CM | POA: Diagnosis not present

## 2022-12-09 DIAGNOSIS — I429 Cardiomyopathy, unspecified: Secondary | ICD-10-CM | POA: Diagnosis not present

## 2023-03-03 DIAGNOSIS — I5022 Chronic systolic (congestive) heart failure: Secondary | ICD-10-CM | POA: Diagnosis not present

## 2023-03-03 DIAGNOSIS — I428 Other cardiomyopathies: Secondary | ICD-10-CM | POA: Diagnosis not present

## 2023-03-30 DIAGNOSIS — H6121 Impacted cerumen, right ear: Secondary | ICD-10-CM | POA: Diagnosis not present

## 2023-03-30 DIAGNOSIS — H902 Conductive hearing loss, unspecified: Secondary | ICD-10-CM | POA: Diagnosis not present

## 2023-04-07 DIAGNOSIS — H5203 Hypermetropia, bilateral: Secondary | ICD-10-CM | POA: Diagnosis not present

## 2023-04-09 DIAGNOSIS — H524 Presbyopia: Secondary | ICD-10-CM | POA: Diagnosis not present

## 2023-04-10 DIAGNOSIS — I110 Hypertensive heart disease with heart failure: Secondary | ICD-10-CM | POA: Diagnosis not present

## 2023-04-10 DIAGNOSIS — I5022 Chronic systolic (congestive) heart failure: Secondary | ICD-10-CM | POA: Diagnosis not present

## 2023-04-10 DIAGNOSIS — E119 Type 2 diabetes mellitus without complications: Secondary | ICD-10-CM | POA: Diagnosis not present

## 2023-04-10 DIAGNOSIS — E7800 Pure hypercholesterolemia, unspecified: Secondary | ICD-10-CM | POA: Diagnosis not present

## 2023-05-13 DIAGNOSIS — R7989 Other specified abnormal findings of blood chemistry: Secondary | ICD-10-CM | POA: Diagnosis not present

## 2023-05-13 DIAGNOSIS — R0789 Other chest pain: Secondary | ICD-10-CM | POA: Diagnosis not present

## 2023-05-13 DIAGNOSIS — I429 Cardiomyopathy, unspecified: Secondary | ICD-10-CM | POA: Diagnosis not present

## 2023-05-13 DIAGNOSIS — I10 Essential (primary) hypertension: Secondary | ICD-10-CM | POA: Diagnosis not present

## 2023-05-13 DIAGNOSIS — I471 Supraventricular tachycardia, unspecified: Secondary | ICD-10-CM | POA: Diagnosis not present

## 2023-05-13 DIAGNOSIS — I5023 Acute on chronic systolic (congestive) heart failure: Secondary | ICD-10-CM | POA: Diagnosis not present

## 2023-05-13 DIAGNOSIS — E7800 Pure hypercholesterolemia, unspecified: Secondary | ICD-10-CM | POA: Diagnosis not present

## 2023-05-13 DIAGNOSIS — I5022 Chronic systolic (congestive) heart failure: Secondary | ICD-10-CM | POA: Diagnosis not present

## 2023-05-13 DIAGNOSIS — E669 Obesity, unspecified: Secondary | ICD-10-CM | POA: Diagnosis not present

## 2023-10-05 ENCOUNTER — Ambulatory Visit: Payer: Medicare HMO | Admitting: Urology

## 2023-10-07 ENCOUNTER — Ambulatory Visit: Payer: Medicare HMO | Admitting: Urology

## 2023-10-14 ENCOUNTER — Ambulatory Visit: Payer: Medicare HMO | Admitting: Urology

## 2023-10-14 ENCOUNTER — Encounter: Payer: Self-pay | Admitting: Urology

## 2023-10-14 VITALS — BP 142/96 | HR 91 | Ht 70.0 in | Wt 214.0 lb

## 2023-10-14 DIAGNOSIS — N401 Enlarged prostate with lower urinary tract symptoms: Secondary | ICD-10-CM

## 2023-10-14 DIAGNOSIS — N529 Male erectile dysfunction, unspecified: Secondary | ICD-10-CM

## 2023-10-14 DIAGNOSIS — Z125 Encounter for screening for malignant neoplasm of prostate: Secondary | ICD-10-CM

## 2023-10-14 DIAGNOSIS — R350 Frequency of micturition: Secondary | ICD-10-CM

## 2023-10-14 DIAGNOSIS — N5201 Erectile dysfunction due to arterial insufficiency: Secondary | ICD-10-CM | POA: Diagnosis not present

## 2023-10-14 LAB — BLADDER SCAN AMB NON-IMAGING: Scan Result: 0

## 2023-10-14 MED ORDER — SILDENAFIL CITRATE 100 MG PO TABS: ORAL_TABLET | ORAL | 3 refills | Status: AC

## 2023-10-14 MED ORDER — TADALAFIL 5 MG PO TABS: 5.0000 mg | ORAL_TABLET | Freq: Every day | ORAL | 3 refills | Status: AC

## 2023-10-14 NOTE — Progress Notes (Signed)
 I, Maysun Anabel Bene, acting as a scribe for Riki Altes, MD., have documented all relevant documentation on the behalf of Riki Altes, MD, as directed by Riki Altes, MD while in the presence of Riki Altes, MD.  10/14/2023 12:33 PM   Anthony [name] 1955/08/10 [id number]  Referring provider: [facility] [address] [place],  [place] [address]  Chief Complaint  Patient presents with   Benign Prostatic Hypertrophy   Urologic history 1. BPH with LUTS Tadalafil 5 mg daily  2. Erectile dysfunction Sildenafil 50-100 mg 1 hour prior to intercourse.  HPI: Anthony [name] is a 68 y.o. male presents for annual follow-up.  States he has been off tadalafil and sildenafil due to cost.  PSA ordered by PCP 10/08/22 was stable at 1.05. He is scheduled to have blood work drawn later this month, which will include a PSA.   PMH: Past Medical History:  Diagnosis Date   Dysrhythmia    Hyperlipidemia    Hypertension    Hypokalemia    Palpitations    SVT (supraventricular tachycardia) (HCC)     Surgical History: Past Surgical History:  Procedure Laterality Date   COLONOSCOPY WITH PROPOFOL N/A 01/15/2016   Procedure: COLONOSCOPY WITH PROPOFOL;  Surgeon: Christena Deem, MD;  Location: [facility] ENDOSCOPY;  Service: Endoscopy;  Laterality: N/A;   TONSILLECTOMY      Home Medications:  Allergies as of 10/14/2023       Reactions   No Known Allergies         Medication List        Accurate as of October 14, 2023 12:33 PM. If you have any questions, ask your nurse or doctor.          STOP taking these medications    sildenafil 20 MG tablet Commonly known as: REVATIO Stopped by: Riki Altes       TAKE these medications    carvedilol 6.25 MG tablet Commonly known as: COREG Take 6.25 mg by mouth.   rosuvastatin 20 MG tablet Commonly known as: CRESTOR Take 20 mg by mouth daily.   sildenafil 100 MG tablet Commonly known as: VIAGRA Take 1  tab 1 hour to intercourse Started by: Riki Altes   tadalafil 5 MG tablet Commonly known as: CIALIS Take 1 tablet (5 mg total) by mouth daily.        Allergies:  Allergies  Allergen Reactions   No Known Allergies     Family History: Family History  Problem Relation Age of Onset   Heart attack Mother 75   Hypertension Father    Hyperlipidemia Father    Hypertension Sister     Social History:  reports that he has never smoked. He has never been exposed to tobacco smoke. He has never used smokeless tobacco. He reports that he does not drink alcohol and does not use drugs.   Physical Exam: BP (!) 142/96   Pulse 91   Ht 5\' 10"  (1.778 m)   Wt 214 lb (97.1 kg)   BMI 30.71 kg/m   Constitutional:  Alert and oriented, No acute distress. HEENT: [place] AT, moist mucus membranes.  Trachea midline, no masses. Cardiovascular: No clubbing, cyanosis, or edema. Respiratory: Normal respiratory effort, no increased work of breathing. GI: Abdomen is soft, nontender, nondistended, no abdominal masses GU: Declined DRE today.  Skin: No rashes, bruises or suspicious lesions. Neurologic: Grossly intact, no focal deficits, moving all 4 extremities. Psychiatric: Normal mood  and affect.   Assessment & Plan:    1. BPH with LUTS We discussed the availability of a low-dose tadalafil using [facility]. It is inexpensive if he does not try to run through insurance.  Rx tadalafil 5mg  sent to [facility].   2. Erectile dysfunction Rx sildenafil sent to [facility].   3. Prostate cancer screening Scheduled to have PSA drawn later this month He indicated he would like DRE performed every other year. DRE was benign last year.  [facility] [address] [place], [place] [address] [phone]

## 2023-11-11 ENCOUNTER — Other Ambulatory Visit: Payer: Self-pay | Admitting: Internal Medicine

## 2023-11-11 DIAGNOSIS — I429 Cardiomyopathy, unspecified: Secondary | ICD-10-CM

## 2024-01-27 ENCOUNTER — Ambulatory Visit

## 2024-04-26 ENCOUNTER — Encounter: Payer: Self-pay | Admitting: Urology

## 2024-10-12 ENCOUNTER — Ambulatory Visit: Admitting: Urology

## 2024-10-13 ENCOUNTER — Ambulatory Visit: Admitting: Urology
